# Patient Record
Sex: Female | Born: 1950 | Race: Black or African American | Hispanic: No | Smoking: Former smoker
Health system: Southern US, Community
[De-identification: ages and names within clinical notes are randomized; demographics above are authoritative.]

## PROBLEM LIST (undated history)

## (undated) DIAGNOSIS — M810 Age-related osteoporosis without current pathological fracture: Secondary | ICD-10-CM

## (undated) DIAGNOSIS — R0600 Dyspnea, unspecified: Secondary | ICD-10-CM

## (undated) DIAGNOSIS — I10 Essential (primary) hypertension: Secondary | ICD-10-CM

## (undated) DIAGNOSIS — C801 Malignant (primary) neoplasm, unspecified: Secondary | ICD-10-CM

## (undated) DIAGNOSIS — M5430 Sciatica, unspecified side: Secondary | ICD-10-CM

## (undated) DIAGNOSIS — I700 Atherosclerosis of aorta: Secondary | ICD-10-CM

## (undated) DIAGNOSIS — I422 Other hypertrophic cardiomyopathy: Secondary | ICD-10-CM

## (undated) DIAGNOSIS — R011 Cardiac murmur, unspecified: Secondary | ICD-10-CM

## (undated) DIAGNOSIS — M419 Scoliosis, unspecified: Secondary | ICD-10-CM

## (undated) DIAGNOSIS — R002 Palpitations: Secondary | ICD-10-CM

## (undated) DIAGNOSIS — E785 Hyperlipidemia, unspecified: Secondary | ICD-10-CM

## (undated) HISTORY — DX: Palpitations: R00.2

## (undated) HISTORY — DX: Essential (primary) hypertension: I10

## (undated) HISTORY — DX: Atherosclerosis of aorta: I70.0

## (undated) HISTORY — DX: Hyperlipidemia, unspecified: E78.5

## (undated) HISTORY — DX: Other hypertrophic cardiomyopathy: I42.2

## (undated) HISTORY — PX: WISDOM TOOTH EXTRACTION: SHX21

---

## 1995-10-09 HISTORY — PX: ABDOMINAL HYSTERECTOMY: SHX81

## 2006-10-08 HISTORY — PX: HIP ARTHROPLASTY: SHX981

## 2011-10-09 HISTORY — PX: HIP ARTHROPLASTY: SHX981

## 2021-09-06 ENCOUNTER — Other Ambulatory Visit: Payer: Self-pay

## 2021-09-06 ENCOUNTER — Other Ambulatory Visit: Payer: Self-pay | Admitting: Family Medicine

## 2021-09-06 ENCOUNTER — Ambulatory Visit
Admission: RE | Admit: 2021-09-06 | Discharge: 2021-09-06 | Disposition: A | Discharge status: Home / Self Care | Payer: Self-pay | Source: Ambulatory Visit | Attending: Family Medicine | Admitting: Family Medicine

## 2021-09-06 DIAGNOSIS — M25552 Pain in left hip: Secondary | ICD-10-CM

## 2021-09-06 DIAGNOSIS — M5430 Sciatica, unspecified side: Secondary | ICD-10-CM

## 2021-10-05 ENCOUNTER — Encounter (HOSPITAL_COMMUNITY): Payer: Self-pay | Admitting: Radiology

## 2021-10-24 ENCOUNTER — Other Ambulatory Visit: Payer: Self-pay | Admitting: Family Medicine

## 2021-10-24 DIAGNOSIS — R5381 Other malaise: Secondary | ICD-10-CM

## 2021-11-13 ENCOUNTER — Other Ambulatory Visit: Payer: Self-pay | Admitting: General Surgery

## 2021-11-13 DIAGNOSIS — M545 Low back pain, unspecified: Secondary | ICD-10-CM

## 2021-11-13 DIAGNOSIS — M5136 Other intervertebral disc degeneration, lumbar region: Secondary | ICD-10-CM

## 2021-12-06 ENCOUNTER — Ambulatory Visit
Admission: RE | Admit: 2021-12-06 | Discharge: 2021-12-06 | Disposition: A | Discharge status: Home / Self Care | Payer: Medicare HMO | Source: Ambulatory Visit | Attending: General Surgery | Admitting: General Surgery

## 2021-12-06 ENCOUNTER — Other Ambulatory Visit: Payer: Self-pay

## 2021-12-06 DIAGNOSIS — M545 Low back pain, unspecified: Secondary | ICD-10-CM

## 2021-12-06 DIAGNOSIS — M5136 Other intervertebral disc degeneration, lumbar region: Secondary | ICD-10-CM

## 2021-12-06 MED ORDER — GADOBENATE DIMEGLUMINE 529 MG/ML IV SOLN
15.0000 mL | Freq: Once | INTRAVENOUS | Status: AC | PRN
  Administered 2021-12-06: 15 mL via INTRAVENOUS

## 2022-01-04 ENCOUNTER — Ambulatory Visit: Payer: Medicare HMO | Admitting: Internal Medicine

## 2022-01-04 ENCOUNTER — Encounter: Payer: Self-pay | Admitting: Internal Medicine

## 2022-01-04 DIAGNOSIS — E042 Nontoxic multinodular goiter: Secondary | ICD-10-CM

## 2022-01-04 DIAGNOSIS — E213 Hyperparathyroidism, unspecified: Secondary | ICD-10-CM | POA: Diagnosis not present

## 2022-01-04 DIAGNOSIS — E559 Vitamin D deficiency, unspecified: Secondary | ICD-10-CM | POA: Diagnosis not present

## 2022-01-04 LAB — BASIC METABOLIC PANEL
BUN: 13 mg/dL (ref 6–23)
CO2: 28 mEq/L (ref 19–32)
Calcium: 10.3 mg/dL (ref 8.4–10.5)
Chloride: 105 mEq/L (ref 96–112)
Creatinine, Ser: 0.81 mg/dL (ref 0.40–1.20)
GFR: 73.41 mL/min (ref >60.00)
Glucose, Bld: 75 mg/dL (ref 70–99)
Potassium: 4 mEq/L (ref 3.5–5.1)
Sodium: 140 mEq/L (ref 135–145)

## 2022-01-04 LAB — ALBUMIN: Albumin: 4.2 g/dL (ref 3.5–5.2)

## 2022-01-04 LAB — VITAMIN D 25 HYDROXY (VIT D DEFICIENCY, FRACTURES): VITD: 18.83 ng/mL — ABNORMAL LOW (ref 30.00–100.00)

## 2022-01-04 LAB — T4, FREE: Free T4: 1.2 ng/dL (ref 0.60–1.60)

## 2022-01-04 LAB — TSH: TSH: 0.69 u[IU]/mL (ref 0.35–5.50)

## 2022-01-04 NOTE — Progress Notes (Signed)
? ? ?Name: Grace Dyer  ?MRN/ DOB: [id number], 1951-03-28    ?Age/ Sex: 71 y.o., female   ? ?PCP: Pcp, No   ?Reason for Endocrinology Evaluation: MNG/ Hypercalcemia   ?   ?Date of Initial Endocrinology Evaluation: 01/04/2022   ? ? ?HPI: ?Grace Dyer is a 71 y.o. female with a past medical history of HTN and Dyslipidemia . The patient presented for initial endocrinology clinic visit on 01/04/2022 for consultative assistance with her Hypercalcemia .  ? ?Grace Dyer indicates that she was first diagnosed with hypercalcemia in 2023.  ? ?Since that time, she has experienced symptoms of  polydipsia, denies constipation or polyuria. . She does use of over the counter calcium  but no lithium, HCTZ, or vitamin D supplements.  ? ?She denies  history of kidney stones, kidney disease, liver disease, granulomatous disease. She has osteoporosis but no  prior fractures. Daily dietary calcium intake: 1 servings . She denies  family history of osteoporosis, parathyroid disease, thyroid disease.  ? ?She has Hx of MNG , was seen by ENT while living in [place] years ago, no hx of FNa's  in the past  ?Has occasional local neck swelling  ? ? ? ? ?HISTORY:  ?Past Medical History: No past medical history on file. ?Past Surgical History:   ?Social History:  reports that she has never smoked. She has never been exposed to tobacco smoke. She has never used smokeless tobacco. ?Family History: family history is not on file. ? ? ?HOME MEDICATIONS: ?Allergies as of 01/04/2022   ?Not on File ?  ? ?  ?Medication List  ?  ? ?  ? Accurate as of January 04, 2022 11:53 AM. If you have any questions, ask your nurse or doctor.  ?  ?  ? ?  ? ?metoprolol succinate 50 MG 24 hr tablet ?Commonly known as: TOPROL-XL ?Take 50 mg by mouth daily. ?  ?NIFEdipine 60 MG 24 hr tablet ?Commonly known as: ADALAT CC ?Take 60 mg by mouth 2 (two) times daily. ?  ?rosuvastatin 10 MG tablet ?Commonly known as: CRESTOR ?Take 10 mg by mouth at bedtime. ?  ?traMADol 50 MG  tablet ?Commonly known as: ULTRAM ?Take 50 mg by mouth as needed. ?  ? ?  ?  ? ? ?REVIEW OF SYSTEMS: ?A comprehensive ROS was conducted with the patient and is negative except as per HPI and below:  ?ROS  ? ? ? ?OBJECTIVE:  ?VS: BP 130/78 (BP Location: Left Arm, Patient Position: Sitting, Cuff Size: Small)   Pulse 68   Ht '5\' 3"'$  (1.6 m)   Wt 165 lb 6.4 oz (75 kg)   SpO2 95%   BMI 29.30 kg/m?   ? ?Wt Readings from Last 3 Encounters:  ?01/04/22 165 lb 6.4 oz (75 kg)  ? ? ? ?EXAM: ?General: Pt appears well and is in NAD  ?Hydration: Well-hydrated with moist mucous membranes and good skin turgor  ?Eyes: External eye exam normal without stare, lid lag or exophthalmos.  EOM intact.  PERRL.  ?Neck: General: Supple without adenopathy. ?Thyroid: Thyroid size normal.  Right  nodule appreciated. No thyroid bruit.  ?Lungs: Clear with good BS bilat with no rales, rhonchi, or wheezes  ?Heart: Auscultation: RRR. + Murmur   ?Abdomen: Normoactive bowel sounds, soft, nontender, without masses or organomegaly palpable  ?Extremities:  ?BL LE: No pretibial edema normal ROM and strength.  ?Mental Status: Judgment, insight: Intact ?Orientation: Oriented to time, place, and person ?Mood and affect: No  depression, anxiety, or agitation  ? ? ? ?DATA REVIEWED: ? ? Latest Reference Range & Units 01/04/22 11:58  ?Sodium 135 - 145 mEq/L 140  ?Potassium 3.5 - 5.1 mEq/L 4.0  ?Chloride 96 - 112 mEq/L 105  ?CO2 19 - 32 mEq/L 28  ?Glucose 70 - 99 mg/dL 75  ?BUN 6 - 23 mg/dL 13  ?Creatinine 0.40 - 1.20 mg/dL 0.81  ?Calcium 8.4 - 10.5 mg/dL 10.3  ?Calcium Ionized 4.7 - 5.5 mg/dL 5.5  ?Albumin 3.5 - 5.2 g/dL 4.2  ?GFR >60.00 mL/min 73.41  ?VITD 30.00 - 100.00 ng/mL 18.83 (L)  ?PTH, Intact 16 - 77 pg/mL 110 (H)  ?TSH 0.35 - 5.50 uIU/mL 0.69  ?T4,Free(Direct) 0.60 - 1.60 ng/dL 1.20  ? ? ?  ? ?ASSESSMENT/PLAN/RECOMMENDATIONS:  ? ? ?Hyperparathyroidism: ?-Patient with elevated PTH, serum calcium including ionized calcium is at the upper limit of  normal ?-Patient tells me that she had submitted 24-hour urine collection and had bone density at PCPs office, these records are not available we will contact them for these results ? ?Recommendations ?Avoid OTC calcium tablets ?Stay hydrated ?Consume 2-3 servings of calcium daily ? ? ? ?2. MNG: ? ? ?-No local neck symptoms ?-She has right thyroid nodule on exam today, will proceed with thyroid ultrasound, she has no history of prior FNA ? ?3.  Vitamin D deficiency ? ? ?-Patient will be advised to start vitamin D3 2000 IU daily ? ?Follow-up in 4 months ? ?Signed electronically by: ?Abby Nena Jordan, MD ? ?[facility]  ?[facility] ?[address] ?[place], [place] [address] ?Phone: [phone] ?FAX: [phone] ? ? ?CC: ?Pcp, No ?No address on file ?Phone: None ?Fax: None ? ? ?Return to Endocrinology clinic as below: ?No future appointments. ?  ? ? ? ? ? ?

## 2022-01-05 ENCOUNTER — Telehealth: Payer: Self-pay | Admitting: Internal Medicine

## 2022-01-05 LAB — CALCIUM, IONIZED: Calcium, Ion: 5.5 mg/dL (ref 4.7–5.5)

## 2022-01-05 LAB — PARATHYROID HORMONE, INTACT (NO CA): PTH: 110 pg/mL — ABNORMAL HIGH (ref 16–77)

## 2022-01-05 NOTE — Telephone Encounter (Signed)
Please let the patient know that her parathyroid hormone remains elevated but her calcium is within the normal range at this time ? ? ?Please let the patient also know that her vitamin D is very low and she needs to start taking over-the-counter vitamin D3 2000 IU daily ? ? ?Otherwise her kidney function and thyroid are normal ? ?Thank you ?

## 2022-01-05 NOTE — Telephone Encounter (Signed)
When you get a chance can you please contact patient's PCP for the results of bone density and 24-hour urine collection? ? ? ?Patient said that she had this recently done through her PCPs office ? ? ? ?Thank you ?

## 2022-01-08 NOTE — Telephone Encounter (Signed)
Spoke with Denton Ar at [facility] and she will put in request for info to be faxed over  ?

## 2022-01-08 NOTE — Telephone Encounter (Signed)
Patient notified and will start the vitamin d.  ?

## 2022-02-15 ENCOUNTER — Other Ambulatory Visit: Payer: Self-pay | Admitting: Family Medicine

## 2022-02-15 ENCOUNTER — Other Ambulatory Visit (HOSPITAL_COMMUNITY): Payer: Self-pay | Admitting: Family Medicine

## 2022-02-15 ENCOUNTER — Telehealth: Payer: Self-pay

## 2022-02-15 DIAGNOSIS — R931 Abnormal findings on diagnostic imaging of heart and coronary circulation: Secondary | ICD-10-CM

## 2022-02-15 NOTE — Telephone Encounter (Signed)
NOTES SCANNED TO REFERRAL 

## 2022-02-26 ENCOUNTER — Encounter: Payer: Self-pay | Admitting: Internal Medicine

## 2022-02-26 ENCOUNTER — Ambulatory Visit (INDEPENDENT_AMBULATORY_CARE_PROVIDER_SITE_OTHER): Payer: Medicare HMO | Admitting: Internal Medicine

## 2022-02-26 VITALS — BP 122/66 | HR 74 | Ht 63.0 in | Wt 165.2 lb

## 2022-02-26 DIAGNOSIS — I10 Essential (primary) hypertension: Secondary | ICD-10-CM

## 2022-02-26 DIAGNOSIS — E782 Mixed hyperlipidemia: Secondary | ICD-10-CM | POA: Diagnosis not present

## 2022-02-26 DIAGNOSIS — I421 Obstructive hypertrophic cardiomyopathy: Secondary | ICD-10-CM | POA: Diagnosis not present

## 2022-02-26 DIAGNOSIS — R002 Palpitations: Secondary | ICD-10-CM

## 2022-02-26 MED ORDER — VERAPAMIL HCL ER 120 MG PO CP24: 120.0000 mg | ORAL_CAPSULE | Freq: Every day | ORAL | 3 refills | Status: DC

## 2022-02-26 MED ORDER — METOPROLOL SUCCINATE ER 100 MG PO TB24: 100.0000 mg | ORAL_TABLET | Freq: Every day | ORAL | 3 refills | Status: AC

## 2022-02-26 NOTE — Progress Notes (Signed)
OFFICE CONSULT NOTE  Chief Complaint:  Palpitations, abnormal echo  Primary Care Physician: Grace Nova, MD  HPI:  Grace Dyer is a 71 y.o. female who is being seen today for the evaluation of palpitations and abnormal echo at the request of Grace Nova, MD. this is a pleasant 71 year old female who recently moved full-time from [facility] to [place].  She says she lived in the area on and off for many years but decided to settle here with her children after retirement.  She was getting cardiac care from at least 2 different cardiologist in [place] but was unsatisfied with it.  She is struggled with palpitations in the past.  She also has a history of hypertension.  Here she has had several episodes of palpitations which include significant tachycardia.  This could occurs at rest and is quite random.  She says she feels very weak when she has these episodes.  She also feels weakness and almost a feeling like she might pass out at times when she gets dehydrated or has a fever or when she has these palpitations.  An echocardiogram was performed which is scanned in under the media tab on Feb 15, 2022.  This demonstrated severe asymmetric septal hypertrophy measuring 1.9 cm.  There was systolic anterior motion of the mitral valve leaflet and a resting gradient of 29 mmHg.  A provoked gradient was not noted.  The findings were suggestive of hypertrophic cardiomyopathy and a cardiac MRI was recommended.  LVEF 65 to 70%.  Subsequently she wore monitoring which demonstrated numerous episodes of SVT and a short episode of NSVT.  She has been on metoprolol for some time and Procardia for hypertension.  Her PCP has ordered a cardiac MRI which is already scheduled for June 26.  She reports no frank syncopal episodes although she has been presyncopal.  She says there is a family history of heart disease but no history of early or sudden cardiac death.  PMHx:  Past Medical  History:  Diagnosis Date   Aortic atherosclerosis ([place])    Dyslipidemia    Hypertension    Hypertrophic cardiomyopathy ([place])    Palpitations    FAMHx:  Heart disease is in her family.  SOCHx:   reports that she has never smoked. She has never been exposed to tobacco smoke. She has never used smokeless tobacco. No history on file for alcohol use and drug use.  ALLERGIES:  Allergies  Allergen Reactions   Codeine Other (See Comments)    DIZZINESS    ROS: Pertinent items noted in HPI and remainder of comprehensive ROS otherwise negative.  HOME MEDS: Current Outpatient Medications on File Prior to Visit  Medication Sig Dispense Refill   rosuvastatin (CRESTOR) 10 MG tablet Take 10 mg by mouth at bedtime.     traMADol (ULTRAM) 50 MG tablet Take 100 mg by mouth as needed.     No current facility-administered medications on file prior to visit.    LABS/IMAGING: No results found for this or any previous visit (from the past 48 hour(s)). No results found.  LIPID PANEL: No results found for: CHOL, TRIG, HDL, CHOLHDL, VLDL, LDLCALC, LDLDIRECT  WEIGHTS: Wt Readings from Last 3 Encounters:  02/26/22 165 lb 3.2 oz (74.9 kg)  01/04/22 165 lb 6.4 oz (75 kg)    VITALS: BP 122/66   Pulse 74   Ht '5\' 3"'$  (1.6 m)   Wt 165 lb 3.2 oz (74.9 kg)  SpO2 97%   BMI 29.26 kg/m   EXAM: General appearance: alert and no distress Neck: no carotid bruit, no JVD, and thyroid not enlarged, symmetric, no tenderness/mass/nodules Lungs: clear to auscultation bilaterally Heart: regular rate and rhythm, S1, S2 normal, and systolic murmur: systolic ejection 3/6, crescendo at 2nd right intercostal space Abdomen: soft, non-tender; bowel sounds normal; no masses,  no organomegaly Extremities: extremities normal, atraumatic, no cyanosis or edema Pulses: 2+ and symmetric Skin: Skin color, texture, turgor normal. No rashes or lesions Neurologic: Grossly normal Psych: Pleasant  EKG: Normal sinus  rhythm at 71, LVH by voltage with septal T wave inversion- personally reviewed  ASSESSMENT: HOCM with a 29 mm rest gradient and SAM, septal diameter 1.9 cm (2023), LVEF 65 to 70% Palpitations-SVT and NSVT Dyslipidemia Aortic atherosclerosis Hypertension  PLAN: 1.   [name] has HOCM by echo although seems to be minimally symptomatic.  It sounds like when she has her SVT or is sick with fever or is dehydrated then she becomes symptomatic likely due to low preload.  She has already been ordered for cardiac MRI by her PCP.  This is scheduled in June.  I would agree with this recommendation.  Blood pressure is well controlled however she is on Procardia.  She is having breakthrough palpitations.  I would advise increasing her metoprolol from 50 to 100 mg daily and switching her Procardia to verapamil which would be more preferred with her HOCM physiology.  Would asked that she follow-up with Dr. Julieanne Dyer in our [place] clinic after her cardiac MRI for his expert recommendations.  Thanks again for the kind referral.  Grace Casino, MD, FACC, [place] Director of the Advanced Lipid Disorders &  Cardiovascular Risk Reduction Clinic Diplomate of the American Board of Clinical Lipidology Attending Cardiologist  Direct Dial: [phone]  Fax: [phone]  Website:  [email or web address]  Grace Dyer 02/26/2022, 1:27 PM

## 2022-02-26 NOTE — Patient Instructions (Signed)
Medication Instructions:  INCREASE metoprolol succinate to '100mg'$  daily  STOP nifedipine  START verapamil '120mg'$  daily  *If you need a refill on your cardiac medications before your next appointment, please call your pharmacy*  Testing/Procedures: Cardiac MRI as scheduled in June   Follow-Up: At [facility], you and your health needs are our priority.  As part of our continuing mission to provide you with exceptional heart care, we have created designated Provider Care Teams.  These Care Teams include your primary Cardiologist (physician) and Advanced Practice Providers (APPs -  Physician Assistants and Nurse Practitioners) who all work together to provide you with the care you need, when you need it.  We recommend signing up for the patient portal called "MyChart".  Sign up information is provided on this After Visit Summary.  MyChart is used to connect with patients for Virtual Visits (Telemedicine).  Patients are able to view lab/test results, encounter notes, upcoming appointments, etc.  Non-urgent messages can be sent to your provider as well.   To learn more about what you can do with MyChart, go to [email or web address].    Your next appointment:   Rudean Haskell, MD -- [place] [address] [place] -- after cardiac MRI

## 2022-03-16 ENCOUNTER — Encounter (HOSPITAL_COMMUNITY): Payer: Self-pay

## 2022-03-16 ENCOUNTER — Ambulatory Visit (HOSPITAL_COMMUNITY)
Admission: EM | Admit: 2022-03-16 | Discharge: 2022-03-16 | Disposition: A | Discharge status: Home / Self Care | Payer: Medicare HMO | Attending: Emergency Medicine | Admitting: Emergency Medicine

## 2022-03-16 ENCOUNTER — Emergency Department (HOSPITAL_COMMUNITY)
Admission: EM | Admit: 2022-03-16 | Discharge: 2022-03-16 | Disposition: A | Discharge status: Home / Self Care | Payer: Medicare HMO | Attending: Emergency Medicine | Admitting: Emergency Medicine

## 2022-03-16 ENCOUNTER — Emergency Department (HOSPITAL_COMMUNITY): Payer: Medicare HMO

## 2022-03-16 DIAGNOSIS — R002 Palpitations: Secondary | ICD-10-CM

## 2022-03-16 DIAGNOSIS — J029 Acute pharyngitis, unspecified: Secondary | ICD-10-CM | POA: Insufficient documentation

## 2022-03-16 DIAGNOSIS — R0602 Shortness of breath: Secondary | ICD-10-CM

## 2022-03-16 DIAGNOSIS — R059 Cough, unspecified: Secondary | ICD-10-CM | POA: Diagnosis not present

## 2022-03-16 LAB — BASIC METABOLIC PANEL
Anion gap: 7 (ref 5–15)
BUN: 11 mg/dL (ref 8–23)
CO2: 25 mmol/L (ref 22–32)
Calcium: 10.1 mg/dL (ref 8.9–10.3)
Chloride: 110 mmol/L (ref 98–111)
Creatinine, Ser: 0.75 mg/dL (ref 0.44–1.00)
GFR, Estimated: >60 mL/min (ref >60)
Glucose, Bld: 115 mg/dL — ABNORMAL HIGH (ref 70–99)
Potassium: 3.4 mmol/L — ABNORMAL LOW (ref 3.5–5.1)
Sodium: 142 mmol/L (ref 135–145)

## 2022-03-16 LAB — CBC
HCT: 34.6 % — ABNORMAL LOW (ref 36.0–46.0)
Hemoglobin: 11.7 g/dL — ABNORMAL LOW (ref 12.0–15.0)
MCH: 32.5 pg (ref 26.0–34.0)
MCHC: 33.8 g/dL (ref 30.0–36.0)
MCV: 96.1 fL (ref 80.0–100.0)
Platelets: 223 10*3/uL (ref 150–400)
RBC: 3.6 MIL/uL — ABNORMAL LOW (ref 3.87–5.11)
RDW: 14.3 % (ref 11.5–15.5)
WBC: 7.1 10*3/uL (ref 4.0–10.5)
nRBC: 0 % (ref 0.0–0.2)

## 2022-03-16 LAB — TROPONIN I (HIGH SENSITIVITY)
Troponin I (High Sensitivity): 7 ng/L (ref <18)
Troponin I (High Sensitivity): 7 ng/L (ref <18)

## 2022-03-16 MED ORDER — ALBUTEROL SULFATE HFA 108 (90 BASE) MCG/ACT IN AERS
2.0000 | INHALATION_SPRAY | Freq: Once | RESPIRATORY_TRACT | Status: AC
  Administered 2022-03-16: 2 via RESPIRATORY_TRACT
  Filled 2022-03-16: qty 6.7

## 2022-03-16 MED ORDER — POTASSIUM CHLORIDE CRYS ER 20 MEQ PO TBCR
40.0000 meq | EXTENDED_RELEASE_TABLET | Freq: Once | ORAL | Status: AC
  Administered 2022-03-16: 40 meq via ORAL
  Filled 2022-03-16: qty 2

## 2022-03-16 NOTE — Discharge Instructions (Addendum)
If you develop new or worsening shortness of breath or if you develop chest pain, coughing up blood, fever, or any other new/concerning symptoms then return to the ER for evaluation.  You may use the albuterol inhaler 1-2 puffs every 4 hours as needed for cough or shortness of breath.  If he needed significantly more than this then return to the ER for evaluation.
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
## 2022-03-16 NOTE — Discharge Instructions (Addendum)
Please go to the nearest hospital for further evaluation of your heart, the EKG completed today shows abnormality when compared to your EKG on 02/26/2022 done in the doctor's office
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
## 2022-03-16 NOTE — ED Triage Notes (Signed)
Pt states that last night a neighbor was burning incense and pt began experiencing SOB and feeling generally unwell. Pt was seen at [facility] for same and sent to ED. Discharge paperwork notes change in EKG from recent visit on 5/22.

## 2022-03-29 ENCOUNTER — Telehealth (HOSPITAL_COMMUNITY): Payer: Self-pay | Admitting: Emergency Medicine

## 2022-03-29 NOTE — Telephone Encounter (Signed)
Reaching out to patient to offer assistance regarding upcoming cardiac imaging study; pt verbalizes understanding of appt date/time, parking situation and where to check in, pre-test NPO status and medications ordered, and verified current allergies; name and call back number provided for further questions should they arise Grace Bond RN Navigator Cardiac Imaging [facility] [phone] office [phone] cell  Arrival 330 Denies claustro Bilat hip replacements  Denies iv issues

## 2022-04-02 ENCOUNTER — Ambulatory Visit (HOSPITAL_COMMUNITY): Admission: RE | Admit: 2022-04-02 | Payer: Medicare HMO | Source: Ambulatory Visit

## 2022-04-03 ENCOUNTER — Other Ambulatory Visit (HOSPITAL_COMMUNITY): Payer: Medicare HMO

## 2022-04-18 ENCOUNTER — Telehealth (HOSPITAL_COMMUNITY): Payer: Self-pay | Admitting: Emergency Medicine

## 2022-04-18 NOTE — Telephone Encounter (Signed)
Attempted to call patient regarding upcoming cardiac MRI appointment. Left message on voicemail with name and callback number Zelta Enfield RN Navigator Cardiac Imaging [facility] [phone] Office [phone] Cell  

## 2022-04-19 ENCOUNTER — Ambulatory Visit (HOSPITAL_COMMUNITY)
Admission: RE | Admit: 2022-04-19 | Discharge: 2022-04-19 | Disposition: A | Discharge status: Home / Self Care | Payer: Medicare HMO | Source: Ambulatory Visit | Attending: Family Medicine | Admitting: Family Medicine

## 2022-04-19 ENCOUNTER — Other Ambulatory Visit (HOSPITAL_COMMUNITY): Payer: Self-pay | Admitting: Family Medicine

## 2022-04-19 DIAGNOSIS — I3139 Other pericardial effusion (noninflammatory): Secondary | ICD-10-CM

## 2022-04-19 DIAGNOSIS — R931 Abnormal findings on diagnostic imaging of heart and coronary circulation: Secondary | ICD-10-CM | POA: Diagnosis present

## 2022-04-19 MED ORDER — GADOBUTROL 1 MMOL/ML IV SOLN
9.0000 mL | Freq: Once | INTRAVENOUS | Status: AC | PRN
  Administered 2022-04-19: 9 mL via INTRAVENOUS

## 2022-04-25 ENCOUNTER — Ambulatory Visit (INDEPENDENT_AMBULATORY_CARE_PROVIDER_SITE_OTHER): Payer: Medicare HMO | Admitting: Internal Medicine

## 2022-04-25 ENCOUNTER — Encounter: Payer: Self-pay | Admitting: Internal Medicine

## 2022-04-25 VITALS — BP 140/82 | HR 80 | Ht 63.0 in | Wt 161.4 lb

## 2022-04-25 DIAGNOSIS — E213 Hyperparathyroidism, unspecified: Secondary | ICD-10-CM | POA: Insufficient documentation

## 2022-04-25 DIAGNOSIS — E559 Vitamin D deficiency, unspecified: Secondary | ICD-10-CM | POA: Diagnosis not present

## 2022-04-25 DIAGNOSIS — E042 Nontoxic multinodular goiter: Secondary | ICD-10-CM | POA: Insufficient documentation

## 2022-04-25 LAB — BASIC METABOLIC PANEL
BUN: 20 mg/dL (ref 6–23)
CO2: 28 mEq/L (ref 19–32)
Calcium: 10.4 mg/dL (ref 8.4–10.5)
Chloride: 104 mEq/L (ref 96–112)
Creatinine, Ser: 0.89 mg/dL (ref 0.40–1.20)
GFR: 65.43 mL/min (ref >60.00)
Glucose, Bld: 84 mg/dL (ref 70–99)
Potassium: 4.3 mEq/L (ref 3.5–5.1)
Sodium: 138 mEq/L (ref 135–145)

## 2022-04-25 LAB — ALBUMIN: Albumin: 4 g/dL (ref 3.5–5.2)

## 2022-04-25 LAB — VITAMIN D 25 HYDROXY (VIT D DEFICIENCY, FRACTURES): VITD: 19.63 ng/mL — ABNORMAL LOW (ref 30.00–100.00)

## 2022-04-25 NOTE — Patient Instructions (Signed)
Stay Hydrated  Avoid over the counter calcium tablet  Consume 2-3 servings of calcium in the food daily ( low fat dairy and green leafy vegetables) Vitamin D 2000 iu daily

## 2022-04-25 NOTE — Progress Notes (Signed)
Name: Grace Dyer  MRN/ DOB: [id number], 03-30-1951    Age/ Sex: 71 y.o., female    PCP: Roselee Nova, MD   Reason for Endocrinology Evaluation: MNG/ Hypercalcemia      Date of Initial Endocrinology Evaluation: 01/04/2022    HPI: Grace Dyer is a 71 y.o. female with a past medical history of HTN, cardiomyopathy,SVT and Dyslipidemia . The patient presented for initial endocrinology clinic visit on 01/04/2022 for consultative assistance with her Hypercalcemia .   Grace Dyer indicates that she was first diagnosed with hypercalcemia in 2023.   She has no prior history of lithium, or HCTZ use  She denies  history of kidney stones, kidney disease, liver disease, granulomatous disease. She has osteoporosis but no  prior fractures.  She denies  family history of osteoporosis, parathyroid disease, thyroid disease.   She has Hx of MNG , was seen by ENT while living in [place] years ago, no hx of FNa's  in the past  Has occasional local neck swelling   24-hour urine collection for calcium 179 mg on 10/25/2021 DXA 10/2021- normal AP T-score -0.5, 1/3rd forearm -0.9    On her initial visit to our clinic she was noted with low vitamin D, she was started on a OTC vitamin D3 2000 IU daily  SUBJECTIVE:     Today (04/25/22): [name] is here for follow-up on hypercalcemia, hyperparathyroidism, and multinodular goiter.  She presented to the ED in June 2023 with upper respiratory symptoms She is not on vitamin D , forgot to take vitamin D  She has been consuming calcium through her diet She stays hydrated  Denies constipation  Has occasional polydipsia but no frequency  She did not have her thyroid ultrasound yet     Vitamin D3 2000 iu daily - not taking   HISTORY:  Past Medical History:  Past Medical History:  Diagnosis Date   Aortic atherosclerosis (HCC)    Dyslipidemia    Hypertension    Hypertrophic cardiomyopathy ([place])    Palpitations    Past Surgical  History:   Social History:  reports that she has never smoked. She has never been exposed to tobacco smoke. She has never used smokeless tobacco. Family History: family history is not on file.   HOME MEDICATIONS: Allergies as of 04/25/2022       Reactions   Codeine Other (See Comments)   DIZZINESS        Medication List        Accurate as of April 25, 2022  7:14 AM. If you have any questions, ask your nurse or doctor.          metoprolol succinate 100 MG 24 hr tablet Commonly known as: TOPROL-XL Take 1 tablet (100 mg total) by mouth daily.   rosuvastatin 10 MG tablet Commonly known as: CRESTOR Take 10 mg by mouth at bedtime.   traMADol 50 MG tablet Commonly known as: ULTRAM Take 100 mg by mouth as needed.   verapamil 120 MG 24 hr capsule Commonly known as: Verelan Take 1 capsule (120 mg total) by mouth at bedtime.          REVIEW OF SYSTEMS: A comprehensive ROS was conducted with the patient and is negative except as per HPI and below:  ROS     OBJECTIVE:  VS:BP 140/82 (BP Location: Left Arm, Patient Position: Sitting, Cuff Size: Large)   Pulse 80   Ht '5\' 3"'$  (1.6 m)   Wt 161 lb  6.4 oz (73.2 kg)   SpO2 96%   BMI 28.59 kg/m    Wt Readings from Last 3 Encounters:  03/16/22 167 lb (75.8 kg)  02/26/22 165 lb 3.2 oz (74.9 kg)  01/04/22 165 lb 6.4 oz (75 kg)     EXAM: General: Pt appears well and is in NAD  Hydration: Well-hydrated with moist mucous membranes and good skin turgor  Eyes: External eye exam normal without stare, lid lag or exophthalmos.  EOM intact.    Neck: General: Supple without adenopathy. Thyroid: Thyroid size normal.  Right  nodule appreciated. No thyroid bruit.  Lungs: Clear with good BS bilat with no rales, rhonchi, or wheezes  Heart: Auscultation: RRR. + Murmur   Abdomen: Normoactive bowel sounds, soft, nontender, without masses or organomegaly palpable  Extremities:  BL LE: No pretibial edema normal ROM and strength.   Mental Status: Judgment, insight: Intact Orientation: Oriented to time, place, and person Mood and affect: No depression, anxiety, or agitation     DATA REVIEWED:   Latest Reference Range & Units 04/25/22 09:51  Sodium 135 - 145 mEq/L 138  Potassium 3.5 - 5.1 mEq/L 4.3  Chloride 96 - 112 mEq/L 104  CO2 19 - 32 mEq/L 28  Glucose 70 - 99 mg/dL 84  BUN 6 - 23 mg/dL 20  Creatinine 0.40 - 1.20 mg/dL 0.89  Calcium 8.4 - 10.5 mg/dL 10.4  Albumin 3.5 - 5.2 g/dL 4.0  GFR >60.00 mL/min 65.43  VITD 30.00 - 100.00 ng/mL 19.63 (L)    DXA 10/2021  AP t-score -0.5 1/3rd distal forearm -0.9  ASSESSMENT/PLAN/RECOMMENDATIONS:    Hyperparathyroidism:  -Patient with elevated PTH, serum calcium including ionized calcium is at the upper limit of normal -Repeat serum calcium continues to be normal, PTH pending -24-hour urinary calcium was normal at 179 mg in January 2023 -She had a normal DXA scan in January 2023 including a T score of -0.9 at the distal one third of the forearm -No indication for surgical intervention at this time    Recommendations Avoid OTC calcium tablets Stay hydrated Consume 2-3 servings of calcium daily    2. MNG:  -No local neck symptoms -She has right thyroid nodule on exam today, I have ordered thyroid ultrasound on her last visit here but this has not been done yet - will proceed with another order for thyroid ultrasound, she has no history of prior FNA  3.  Vitamin D deficiency   -Patient will be advised to start vitamin D3 2000 IU daily  Follow-up in 4 months  Signed electronically by: Mack Guise, MD  [facility] Endocrinology  [facility] [address] [place], [place] [address] Phone: [phone] FAX: [phone]   CC: Roselee Nova, [place] [address] Phone: [phone] Fax: [phone]   Return to Endocrinology clinic as below: Future Appointments  Date Time  Provider [place]  04/25/2022 10:10 AM Chantelle Verdi, Melanie Crazier, MD [facility] None  04/30/2022  9:40 AM Werner Lean, MD [facility]

## 2022-04-26 ENCOUNTER — Encounter: Payer: Self-pay | Admitting: Internal Medicine

## 2022-04-26 LAB — PARATHYROID HORMONE, INTACT (NO CA): PTH: 90 pg/mL — ABNORMAL HIGH (ref 16–77)

## 2022-04-26 LAB — CALCIUM, IONIZED: Calcium, Ion: 5.6 mg/dL — ABNORMAL HIGH (ref 4.7–5.5)

## 2022-04-28 NOTE — Progress Notes (Unsigned)
Cardiology Office Note:    Date:  04/30/2022   ID:  [name], DOB Feb 28, 1951, MRN [id number]  PCP:  Roselee Nova, MD   [place] Providers Cardiologist:  None     Referring MD: Roselee Nova, MD   CC: HCM Consulted for the evaluation of HCM at the behest of Dr. Debara Pickett  History of Present Illness:    Grace Dyer is a 71 y.o. female with a hx of HCM (resting LVOT gradient 29, max thickness 19 mm, ~1% LGE using 6 standard deviation approach).    Feels OK Takes care of her grandchildren, tutoring youngest grandchild. Doesn't do much exercise because she feels palpitations; sometimes with near syncope; decreased her exercise in order to keep her from having symptoms. Patient notes no SOB at rest and DOE rarely going up stairs Keeps well hydrated.  Eats small frequent meals Notes a 50 lb weight loss that she attributes to changing her died (over that span of one year) Notes no fatigue. Notes only exertional palpitations; history of nocturnal palpitations but none lately Notes no CP. Notes no Dizziness. Notes no syncope. Notable family events include No HCM history; GMA died at 18, Mother died at 71 of unclear causes (didn't talk about her medical history) four kids and 12 grandchildren.   Past Medical History:  Diagnosis Date   Aortic atherosclerosis ([place])    Dyslipidemia    Hypertension    Hypertrophic cardiomyopathy ([place])    Palpitations     No past surgical history on file.  Current Medications: Current Meds  Medication Sig   metoprolol succinate (TOPROL-XL) 100 MG 24 hr tablet Take 1 tablet (100 mg total) by mouth daily.   NIFEdipine (ADALAT CC) 60 MG 24 hr tablet Take 1 tablet (60 mg total) by mouth daily.   rosuvastatin (CRESTOR) 10 MG tablet Take 10 mg by mouth at bedtime.   traMADol (ULTRAM-ER) 100 MG 24 hr tablet Take 150 mg by mouth daily.   [DISCONTINUED] verapamil (VERELAN) 120 MG 24 hr capsule Take 1 capsule (120 mg total) by  mouth at bedtime.     Allergies:   Codeine   Social History   Socioeconomic History   Marital status: Widowed    Spouse name: Not on file   Number of children: Not on file   Years of education: Not on file   Highest education level: Not on file  Occupational History   Not on file  Tobacco Use   Smoking status: Never    Passive exposure: Never   Smokeless tobacco: Never  Substance and Sexual Activity   Alcohol use: Not on file   Drug use: Not on file   Sexual activity: Not on file  Other Topics Concern   Not on file  Social History Narrative   Not on file   Social Determinants of Health   Financial Resource Strain: Not on file  Food Insecurity: Not on file  Transportation Needs: Not on file  Physical Activity: Not on file  Stress: Not on file  Social Connections: Not on file    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     All other systems reviewed and are negative.  EKGs/Labs/Other Studies Reviewed:     Recent Labs: 01/04/2022: TSH 0.69 03/16/2022: Hemoglobin 11.7; Platelets 223 04/25/2022: BUN 20; Creatinine, Ser 0.89; Potassium 4.3; Sodium 138  Recent Lipid Panel  No results found for: "CHOL", "TRIG", "HDL", "CHOLHDL", "VLDL", "LDLCALC", "LDLDIRECT"      Physical Exam:    VS:  BP (!) 152/73   Pulse (!) 46   Ht '5\' 3"'$  (1.6 m)   Wt 162 lb (73.5 kg)   SpO2 97%   BMI 28.70 kg/m     Wt Readings from Last 3 Encounters:  04/30/22 162 lb (73.5 kg)  04/25/22 161 lb 6.4 oz (73.2 kg)  03/16/22 167 lb (75.8 kg)    GEN:  Well nourished, well developed in no acute distress HEENT: Normal NECK: No JVD LYMPHATICS: No lymphadenopathy CARDIAC: regular bradycardia with systolic murmur while standing and with hand grip, no rubs, gallops RESPIRATORY:  Clear to auscultation without rales, wheezing or rhonchi  ABDOMEN: Soft, non-tender, non-distended MUSCULOSKELETAL:  No  edema; No deformity  SKIN: Warm and dry NEUROLOGIC:  Alert and oriented x 3 PSYCHIATRIC:  Normal affect   ASSESSMENT:    1. Hypertrophic cardiomyopathy (HCC)    PLAN:    Hypertrophic Cardiomyopathy - Septal Variant - peak gradient 29 mm Hg on resting echo   - NYHA II  - Family history with no prior HCM or SCD, Discussed family screening  (Recommend genetics testing- will refer)   -Will do exercise Stress Echo to assess for inducible gradient - CMR notable thickness, minimal LGE using 6 standard deviation method  - SVT and one episode of NSVT form heart monitor from Dr. Manuella Ghazi, will get original monitor study  - continue succinate 100 mg Daily - patient is adamant that her BP is best controlled on nifedipine; she has slow heart rates of verapamil; though diltiazem or verapamil are better choice are and I offered her aldactone for BP; she would like to switch back to her nifedinpine 60 mg PO daily  Will see in three months for SCD discussion, post eval for inducible gradient, and after genetics discussion  Time Spent Directly with Patient:   I have spent a total of 40 minutes with the patient reviewing notes, imaging, EKGs, labs and examining the patient as well as establishing an assessment and plan that was discussed personally with the patient.  > 50% of time was spent in direct patient care.   Medication Adjustments/Labs and Tests Ordered: Current medicines are reviewed at length with the patient today.  Concerns regarding medicines are outlined above.  Orders Placed This Encounter  Procedures   Cardiac Stress Test: Informed Consent Details: Physician/Practitioner Attestation; Transcribe to consent form and obtain patient signature   ECHOCARDIOGRAM STRESS TEST   Meds ordered this encounter  Medications   NIFEdipine (ADALAT CC) 60 MG 24 hr tablet    Sig: Take 1 tablet (60 mg total) by mouth daily.    Dispense:  90 tablet    Refill:  3    Patient Instructions   Medication Instructions:  Your physician has recommended you make the following change in your medication:  STOP: verapamil   START: nifedipine (Adalat CC) 60 mg by mouth once daily  *If you need a refill on your cardiac medications before your next appointment, please call your pharmacy*   Lab Work: NONE If you have labs (blood work) drawn today and your tests are completely normal, you will receive your results only by: [place] (if you have MyChart) OR A paper copy in the mail If you have any lab test that is abnormal or we need to change your treatment, we will call you to review the results.   Testing/Procedures:  Your physician has requested that you have a stress echocardiogram. For further information please visit [email or web address]. Please follow instruction sheet as given.   Your physician has referred you to see a Dietitian.    Follow-Up: At [facility], you and your health needs are our priority.  As part of our continuing mission to provide you with exceptional heart care, we have created designated Provider Care Teams.  These Care Teams include your primary Cardiologist (physician) and Advanced Practice Providers (APPs -  Physician Assistants and Nurse Practitioners) who all work together to provide you with the care you need, when you need it.  We recommend signing up for the patient portal called "MyChart".  Sign up information is provided on this After Visit Summary.  MyChart is used to connect with patients for Virtual Visits (Telemedicine).  Patients are able to view lab/test results, encounter notes, upcoming appointments, etc.  Non-urgent messages can be sent to your provider as well.   To learn more about what you can do with MyChart, go to [email or web address].    Your next appointment:   3 month(s)  The format for your next appointment:   In Person  Provider:   Rudean Haskell, MD   Important Information About Sugar          Signed, Werner Lean, MD  04/30/2022 10:36 AM    [place]

## 2022-04-30 ENCOUNTER — Ambulatory Visit: Payer: Medicare HMO | Admitting: Internal Medicine

## 2022-04-30 ENCOUNTER — Encounter: Payer: Self-pay | Admitting: Internal Medicine

## 2022-04-30 VITALS — BP 152/73 | HR 46 | Ht 63.0 in | Wt 162.0 lb

## 2022-04-30 DIAGNOSIS — I422 Other hypertrophic cardiomyopathy: Secondary | ICD-10-CM

## 2022-04-30 MED ORDER — NIFEDIPINE ER 60 MG PO TB24: 60.0000 mg | ORAL_TABLET | Freq: Every day | ORAL | 3 refills | Status: DC

## 2022-04-30 NOTE — Patient Instructions (Signed)
Medication Instructions:  Your physician has recommended you make the following change in your medication:  STOP: verapamil   START: nifedipine (Adalat CC) 60 mg by mouth once daily  *If you need a refill on your cardiac medications before your next appointment, please call your pharmacy*   Lab Work: NONE If you have labs (blood work) drawn today and your tests are completely normal, you will receive your results only by: [place] (if you have MyChart) OR A paper copy in the mail If you have any lab test that is abnormal or we need to change your treatment, we will call you to review the results.   Testing/Procedures: Your physician has requested that you have a stress echocardiogram. For further information please visit [email or web address]. Please follow instruction sheet as given.   Your physician has referred you to see a Dietitian.    Follow-Up: At [facility], you and your health needs are our priority.  As part of our continuing mission to provide you with exceptional heart care, we have created designated Provider Care Teams.  These Care Teams include your primary Cardiologist (physician) and Advanced Practice Providers (APPs -  Physician Assistants and Nurse Practitioners) who all work together to provide you with the care you need, when you need it.  We recommend signing up for the patient portal called "MyChart".  Sign up information is provided on this After Visit Summary.  MyChart is used to connect with patients for Virtual Visits (Telemedicine).  Patients are able to view lab/test results, encounter notes, upcoming appointments, etc.  Non-urgent messages can be sent to your provider as well.   To learn more about what you can do with MyChart, go to [email or web address].    Your next appointment:   3 month(s)  The format for your next appointment:   In Person  Provider:   Rudean Haskell, MD   Important Information About Sugar

## 2022-05-04 ENCOUNTER — Ambulatory Visit
Admission: RE | Admit: 2022-05-04 | Discharge: 2022-05-04 | Disposition: A | Discharge status: Home / Self Care | Payer: Medicare HMO | Source: Ambulatory Visit | Attending: Internal Medicine | Admitting: Internal Medicine

## 2022-05-04 DIAGNOSIS — E042 Nontoxic multinodular goiter: Secondary | ICD-10-CM

## 2022-05-08 ENCOUNTER — Telehealth: Payer: Self-pay | Admitting: Internal Medicine

## 2022-05-08 DIAGNOSIS — E042 Nontoxic multinodular goiter: Secondary | ICD-10-CM

## 2022-05-08 NOTE — Telephone Encounter (Signed)
Please let the pt know that her thyroid ultrasound showed 4 thyroid nodules and two of them needs to be biopsied to make sure there's no cancer. I have placed the orders because I am out of the office and didn't want to wait.     Let her know these will be done at the same place where the ultrasound was done.  She will not be put out to sleep but they will numb the area.     Thanks   Abby Nena Jordan, MD  [facility] Endocrinology  [facility] [address] [place], [place] [address] Phone: [phone] FAX: [phone]'

## 2022-05-09 ENCOUNTER — Telehealth (HOSPITAL_COMMUNITY): Payer: Self-pay | Admitting: *Deleted

## 2022-05-09 NOTE — Telephone Encounter (Signed)
Patient given detailed instructions per Stress Test Requisition Sheet for test on 05/15/22 at 1400.Patient Notified to arrive 30 minutes early, and that it is imperative to arrive on time for appointment to keep from having the test rescheduled.  Patient verbalized understanding. Tonjia Parillo, [place]

## 2022-05-09 NOTE — Telephone Encounter (Signed)
Patient has been notified and understands. She will call to schedule. States she received call on yesterday from them.

## 2022-05-11 ENCOUNTER — Ambulatory Visit
Admission: RE | Admit: 2022-05-11 | Discharge: 2022-05-11 | Disposition: A | Discharge status: Home / Self Care | Payer: Medicare HMO | Source: Ambulatory Visit | Attending: Internal Medicine | Admitting: Internal Medicine

## 2022-05-11 ENCOUNTER — Other Ambulatory Visit (HOSPITAL_COMMUNITY)
Admission: RE | Admit: 2022-05-11 | Discharge: 2022-05-11 | Disposition: A | Discharge status: Home / Self Care | Payer: Medicare HMO | Source: Ambulatory Visit | Attending: Interventional Radiology | Admitting: Interventional Radiology

## 2022-05-11 ENCOUNTER — Other Ambulatory Visit: Payer: Self-pay | Admitting: Internal Medicine

## 2022-05-11 DIAGNOSIS — E042 Nontoxic multinodular goiter: Secondary | ICD-10-CM

## 2022-05-11 DIAGNOSIS — E041 Nontoxic single thyroid nodule: Secondary | ICD-10-CM | POA: Diagnosis present

## 2022-05-11 NOTE — Procedures (Signed)
Interventional Radiology Procedure Note  Procedure: US guided biopsy of 2 right side thyroid nodules.  #1 and #3 based on prior report. Both calcified. 5x25g biopsy performed for each, with AFIRMA Complications: None EBL: None Recommendations:   - Routine wound care - Follow up pathology    Signed,  Corrie Mckusick, DO

## 2022-05-14 LAB — CYTOLOGY - NON PAP

## 2022-05-15 ENCOUNTER — Ambulatory Visit (HOSPITAL_COMMUNITY): Payer: Medicare HMO | Attending: Cardiology

## 2022-05-15 ENCOUNTER — Ambulatory Visit (HOSPITAL_COMMUNITY): Payer: Medicare HMO

## 2022-05-15 DIAGNOSIS — I422 Other hypertrophic cardiomyopathy: Secondary | ICD-10-CM | POA: Insufficient documentation

## 2022-05-16 LAB — ECHOCARDIOGRAM STRESS TEST
Area-P 1/2: 3.21 cm2
S' Lateral: 2.3 cm

## 2022-05-18 ENCOUNTER — Telehealth: Payer: Self-pay | Admitting: Internal Medicine

## 2022-05-18 NOTE — Telephone Encounter (Signed)
Patient notified

## 2022-05-18 NOTE — Telephone Encounter (Signed)
Please let the pt know that her thyroid biopsy results are inconclusive ( which means we don't know if they are benign or not yet) , the sample has been sent for further testing and will have to wait additional 3 weeks before we get those results back   Thanks

## 2022-05-30 ENCOUNTER — Telehealth: Payer: Self-pay | Admitting: Internal Medicine

## 2022-05-30 DIAGNOSIS — E042 Nontoxic multinodular goiter: Secondary | ICD-10-CM

## 2022-05-30 NOTE — Telephone Encounter (Signed)
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

## 2022-05-31 ENCOUNTER — Encounter (HOSPITAL_COMMUNITY): Payer: Self-pay

## 2022-06-06 ENCOUNTER — Other Ambulatory Visit: Payer: Self-pay

## 2022-06-06 ENCOUNTER — Emergency Department (HOSPITAL_COMMUNITY)
Admission: EM | Admit: 2022-06-06 | Discharge: 2022-06-06 | Disposition: A | Discharge status: Home / Self Care | Payer: Medicare HMO | Attending: Emergency Medicine | Admitting: Emergency Medicine

## 2022-06-06 ENCOUNTER — Ambulatory Visit (INDEPENDENT_AMBULATORY_CARE_PROVIDER_SITE_OTHER): Payer: Medicare HMO

## 2022-06-06 ENCOUNTER — Other Ambulatory Visit: Payer: Self-pay | Admitting: Cardiology

## 2022-06-06 ENCOUNTER — Emergency Department (HOSPITAL_COMMUNITY): Payer: Medicare HMO

## 2022-06-06 ENCOUNTER — Encounter (HOSPITAL_COMMUNITY): Payer: Self-pay | Admitting: Student

## 2022-06-06 DIAGNOSIS — R002 Palpitations: Secondary | ICD-10-CM | POA: Diagnosis present

## 2022-06-06 DIAGNOSIS — R0602 Shortness of breath: Secondary | ICD-10-CM | POA: Diagnosis not present

## 2022-06-06 DIAGNOSIS — R079 Chest pain, unspecified: Secondary | ICD-10-CM

## 2022-06-06 DIAGNOSIS — I471 Supraventricular tachycardia: Secondary | ICD-10-CM

## 2022-06-06 DIAGNOSIS — I422 Other hypertrophic cardiomyopathy: Secondary | ICD-10-CM | POA: Diagnosis not present

## 2022-06-06 DIAGNOSIS — I493 Ventricular premature depolarization: Secondary | ICD-10-CM | POA: Diagnosis not present

## 2022-06-06 DIAGNOSIS — I10 Essential (primary) hypertension: Secondary | ICD-10-CM | POA: Insufficient documentation

## 2022-06-06 LAB — CBC WITH DIFFERENTIAL/PLATELET
Abs Immature Granulocytes: 0.01 10*3/uL (ref 0.00–0.07)
Basophils Absolute: 0.1 10*3/uL (ref 0.0–0.1)
Basophils Relative: 1 %
Eosinophils Absolute: 0.1 10*3/uL (ref 0.0–0.5)
Eosinophils Relative: 2 %
HCT: 36 % (ref 36.0–46.0)
Hemoglobin: 12.1 g/dL (ref 12.0–15.0)
Immature Granulocytes: 0 %
Lymphocytes Relative: 32 %
Lymphs Abs: 2.1 10*3/uL (ref 0.7–4.0)
MCH: 31.6 pg (ref 26.0–34.0)
MCHC: 33.6 g/dL (ref 30.0–36.0)
MCV: 94 fL (ref 80.0–100.0)
Monocytes Absolute: 0.6 10*3/uL (ref 0.1–1.0)
Monocytes Relative: 9 %
Neutro Abs: 3.7 10*3/uL (ref 1.7–7.7)
Neutrophils Relative %: 56 %
Platelets: 273 10*3/uL (ref 150–400)
RBC: 3.83 MIL/uL — ABNORMAL LOW (ref 3.87–5.11)
RDW: 14.7 % (ref 11.5–15.5)
WBC: 6.6 10*3/uL (ref 4.0–10.5)
nRBC: 0 % (ref 0.0–0.2)

## 2022-06-06 LAB — TSH: TSH: 2.04 u[IU]/mL (ref 0.350–4.500)

## 2022-06-06 LAB — COMPREHENSIVE METABOLIC PANEL
ALT: 17 U/L (ref 0–44)
AST: 20 U/L (ref 15–41)
Albumin: 3.5 g/dL (ref 3.5–5.0)
Alkaline Phosphatase: 96 U/L (ref 38–126)
Anion gap: 7 (ref 5–15)
BUN: 17 mg/dL (ref 8–23)
CO2: 22 mmol/L (ref 22–32)
Calcium: 9.9 mg/dL (ref 8.9–10.3)
Chloride: 109 mmol/L (ref 98–111)
Creatinine, Ser: 0.9 mg/dL (ref 0.44–1.00)
GFR, Estimated: >60 mL/min (ref >60)
Glucose, Bld: 197 mg/dL — ABNORMAL HIGH (ref 70–99)
Potassium: 3.1 mmol/L — ABNORMAL LOW (ref 3.5–5.1)
Sodium: 138 mmol/L (ref 135–145)
Total Bilirubin: 0.5 mg/dL (ref 0.3–1.2)
Total Protein: 7.3 g/dL (ref 6.5–8.1)

## 2022-06-06 LAB — TROPONIN I (HIGH SENSITIVITY)
Troponin I (High Sensitivity): 13 ng/L (ref <18)
Troponin I (High Sensitivity): 12 ng/L (ref <18)

## 2022-06-06 MED ORDER — POTASSIUM CHLORIDE CRYS ER 20 MEQ PO TBCR
40.0000 meq | EXTENDED_RELEASE_TABLET | Freq: Two times a day (BID) | ORAL | Status: AC
  Administered 2022-06-06 (×2): 40 meq via ORAL
  Filled 2022-06-06 (×2): qty 2

## 2022-06-06 MED ORDER — METOPROLOL TARTRATE 25 MG PO TABS: 25.0000 mg | ORAL_TABLET | Freq: Two times a day (BID) | ORAL | Status: DC | PRN

## 2022-06-06 MED ORDER — METOPROLOL TARTRATE 25 MG PO TABS
25.0000 mg | ORAL_TABLET | Freq: Two times a day (BID) | ORAL | 0 refills | Status: AC | PRN
Start: 2022-06-06 — End: ?

## 2022-06-06 NOTE — Discharge Instructions (Signed)
Take metoprolol tartrate as prescribed for palpitations as discussed with cardiology.  They have sent cardiac monitor to your house.  Follow-up with cardiology.  Return if symptoms worsen.

## 2022-06-06 NOTE — Progress Notes (Unsigned)
Enrolled for [employer] to mail a ZIO XT long term holter monitor to the patients address on file.  

## 2022-06-06 NOTE — ED Provider Notes (Signed)
Patient seen by cardiology for her palpitations and chest pain.  History of hypertrophic cardiomyopathy.  They are recommending metoprolol to tartrate as needed as needed for palpitations.  We will arrange for her to get a cardiac monitor and have cleared her for discharge with cardiology follow-up.  Patient comfortable with this plan.  Discharged in good condition.  Understands return precautions.  This chart was dictated using voice recognition software.  Despite best efforts to proofread,  errors can occur which can change the documentation meaning.    Lennice Sites, DO 06/06/22 1119

## 2022-06-06 NOTE — Progress Notes (Signed)
Ordered a 14 day monitor for the evaluation of palpitations. To be read by Dr. Gasper Sells (primary cardiologist)

## 2022-06-06 NOTE — ED Triage Notes (Signed)
Per EMS, pt from home, was woken up w/ palpitations that lasted 15 minutes.  When they resolved she stood up and had mild lightheadedness and SOB.  Only CP when the palpitations return.    324 ASA 20G L AC 132/86 80% 100% RA 16 RR

## 2022-06-06 NOTE — Consult Note (Signed)
Cardiology Consultation:   Patient ID: Grace Dyer MRN: [id number]; DOB: 08/25/1951  Admit date: 06/06/2022 Date of Consult: 06/06/2022  Primary Care Provider: Roselee Nova, MD [facility] Cardiologist: Daisy Floro, MD [facility] Electrophysiologist:  None    Patient Profile:   Grace Dyer is a 71 y.o. female with a hx of HCM (resting LVOT gradient 29, max thickness 19 mm, ~1% LGE using 6 standard deviation approach), palpitations (history of SVT and NSVT), aortic atherosclerosis, vit D deficiency, HTN, multinodular goiter, and dyslipidemia who is being seen today for the evaluation of palpitations at the request of the ED.  History of Present Illness:   [name] is a 70 year old female with above medical history who recently established care with Dr. Gasper Sells.   Regarding her cardiac history, she recently moved full time from [place] to [place] to be closer to her children in retirement. She previously was getting cardiac care from at least 2 different cardiologist in [place].  She has a longstanding history of struggling with palpitations, and during episodes has significant weakness.  Her first echocardiogram was performed Feb 15, 2022, and demonstrated severe asymmetric septal hypertrophy measuring 1.9 cm, along with systolic anterior motion of the mitral valve leaflet and resting gradient of 29 mmHg.  She subsequently wore a Holter monitor, which showed numerous episodes of supraventricular tachycardia and a short episode of NSVT (do not have strips or report).  She has limited her exercise due to feeling of palpitations, sometimes with near syncope.  Has decreased her exercise in order to keep her from having symptoms. For all the above, was referred to the [facility] HOCM clinic with Dr Daisy Floro.  She established with Dr Gasper Sells on 04/30/22, at which point stress echo was ordered, and subsequently performed 05/16/22 which showed  severe inducible HCM gradient. She was referred for family genetic testing screening. Plan to attempt to obtain heart monitor study.  She denies family history of heart disease, denies history of early or sudden cardiac death. However, patient does not know her father and does not know her paternal family history.   Patient presented to the ED on 8/30 complaining of palpitations that lasted for 15 minutes. When the palpitations resolved, she stood up and had mild light headedness and SOB. Labs in the ED showed Na 138, K 3.1, creatinine 0.90, WBC 6.6, hemoglobin 12.1, platelets 273. hsTn 13>>12. TSH within normal limits.   EKG showed normal sinus rhythm, LVH with repolarization abnormality. Chest x-ray showed no evidence of acute cardiopulmonary disease.   On interview, patient reports that she has had a heart murmur for her whole life, however she did not know that she had hypertrophic cardiomyopathy until she moved to [place] earlier this year. She had seen 2 cardiologists in [place], but neither diagnosed with with HCM. She has also had palpitations for several years. Palpitations are usually painful and feel like a pounding in her chest. Not associated with syncope/near syncope, dizziness, sob. She often feels the palpitations when she smells smoke. Palpitations can last up to 15-30 minutes. Sometimes relieved when she sits down and takes deep breaths. She has been told she has hypokalemia before. Recently, she had a thyroid nodule biopsied and there was concern for cancer. She is waiting for a phone call from oncology with further steps.   Yesterday evening, patient was laying in bed and someone was smoking a cigarette outside of her window. She began to feel palpitations. The palpitations felt  similar to palpitations that she had in the past. They lasted for about 15 minutes then resolved. After the palpations went away, she stood up from the side of the bed and felt woozy. She denies syncope/near  syncope, sob. Now in the ER, she has had very brief palpitations but is otherwise in her usual state of health. She denies any recent nausea, vomiting, diarrhea.    Past Medical History:  Diagnosis Date   Aortic atherosclerosis ([place])    Dyslipidemia    Hypertension    Hypertrophic cardiomyopathy ([place])    Palpitations     History reviewed. No pertinent surgical history.   Home Medications:  Prior to Admission medications   Medication Sig Start Date End Date Taking? Authorizing Provider  metoprolol succinate (TOPROL-XL) 100 MG 24 hr tablet Take 1 tablet (100 mg total) by mouth daily. 02/26/22  Yes Hilty, Nadean Corwin, MD  NIFEdipine (ADALAT CC) 60 MG 24 hr tablet Take 1 tablet (60 mg total) by mouth daily. 04/30/22  Yes Chandrasekhar, Mahesh A, MD  rosuvastatin (CRESTOR) 10 MG tablet Take 10 mg by mouth at bedtime. 10/24/21  Yes [provider]  traMADol (ULTRAM-ER) 100 MG 24 hr tablet Take 150 mg by mouth daily.   Yes [provider]    Inpatient Medications: Scheduled Meds:  Continuous Infusions:  PRN Meds:   Allergies:    Allergies  Allergen Reactions   Codeine Other (See Comments)    DIZZINESS    Social History:   Social History   Socioeconomic History   Marital status: Widowed    Spouse name: Not on file   Number of children: Not on file   Years of education: Not on file   Highest education level: Not on file  Occupational History   Not on file  Tobacco Use   Smoking status: Never    Passive exposure: Never   Smokeless tobacco: Never  Substance and Sexual Activity   Alcohol use: Not on file   Drug use: Not on file   Sexual activity: Not on file  Other Topics Concern   Not on file  Social History Narrative   Not on file   Social Determinants of Health   Financial Resource Strain: Not on file  Food Insecurity: Not on file  Transportation Needs: Not on file  Physical Activity: Not on file  Stress: Not on file  Social Connections: Not on  file  Intimate Partner Violence: Not on file    Family History:   Notable family events include No HCM history; GMA died at 56, Mother died at 61 of unclear causes (didn't talk about her medical history) four kids and 12 grandchildren.History reviewed. No pertinent family history.   ROS:  Review of Systems: [y] = yes, '[ ]'$  = no      General: Weight gain '[ ]'$ ; Weight loss '[ ]'$ ; Anorexia '[ ]'$ ; Fatigue '[ ]'$ ; Fever '[ ]'$ ; Chills '[ ]'$ ; Weakness '[ ]'$    Cardiac: Chest pain/pressure '[ ]'$ ; Resting SOB '[ ]'$ ; Exertional SOB '[ ]'$ ; Orthopnea '[ ]'$ ; Pedal Edema '[ ]'$ ; Palpitations '[ ]'$ ; Syncope '[ ]'$ ; Presyncope '[ ]'$ ; Paroxysmal nocturnal dyspnea '[ ]'$    Pulmonary: Cough '[ ]'$ ; Wheezing '[ ]'$ ; Hemoptysis '[ ]'$ ; Sputum '[ ]'$ ; Snoring '[ ]'$    GI: Vomiting '[ ]'$ ; Dysphagia '[ ]'$ ; Melena '[ ]'$ ; Hematochezia '[ ]'$ ; Heartburn '[ ]'$ ; Abdominal pain '[ ]'$ ; Constipation '[ ]'$ ; Diarrhea '[ ]'$ ; BRBPR '[ ]'$    GU: Hematuria '[ ]'$ ; Dysuria '[ ]'$ ;  Nocturia '[ ]'$  Vascular: Pain in legs with walking '[ ]'$ ; Pain in feet with lying flat '[ ]'$ ; Non-healing sores '[ ]'$ ; Stroke '[ ]'$ ; TIA '[ ]'$ ; Slurred speech '[ ]'$ ;   Neuro: Headaches '[ ]'$ ; Vertigo '[ ]'$ ; Seizures '[ ]'$ ; Paresthesias '[ ]'$ ;Blurred vision '[ ]'$ ; Diplopia '[ ]'$ ; Vision changes '[ ]'$    Ortho/Skin: Arthritis '[ ]'$ ; Joint pain '[ ]'$ ; Muscle pain '[ ]'$ ; Joint swelling '[ ]'$ ; Back Pain '[ ]'$ ; Rash '[ ]'$    Psych: Depression '[ ]'$ ; Anxiety '[ ]'$    Heme: Bleeding problems '[ ]'$ ; Clotting disorders '[ ]'$ ; Anemia '[ ]'$    Endocrine: Diabetes '[ ]'$ ; Thyroid dysfunction '[ ]'$    Physical Exam/Data:   Vitals:   06/06/22 0600 06/06/22 0700 06/06/22 0800 06/06/22 0801  BP: (!) 145/101 139/62 (!) 146/92   Pulse: 67 62 (!) 58   Resp: 18 17 (!) 23   Temp:    98.5 F (36.9 C)  TempSrc:    Oral  SpO2: 94% 94% 97%    No intake or output data in the 24 hours ending 06/06/22 0829    04/30/2022    9:31 AM 04/25/2022    9:22 AM 03/16/2022    5:39 PM  Last 3 Weights  Weight (lbs) 162 lb 161 lb 6.4 oz 167 lb  Weight (kg) 73.483 kg 73.211 kg 75.751 kg     There is no height or  weight on file to calculate BMI.  General:  Well nourished, well developed, in no acute distress. Resting comfortably in the bed HEENT: Normal  Neck: no JVD Endocrine:  Mild swelling over the right thyroid, nontender to palpation  Vascular: No carotid bruits; Radial pulses 2+ bilaterally Cardiac:  normal S1, S2; RRR; grade 3/6 systolic murmur throughout with radiation to the carotids  Lungs:  clear to auscultation bilaterally, no wheezing, rhonchi or rales. Normal WOB on room air  Abd: soft, nontender Ext: no edema Musculoskeletal:  No deformities, BUE and BLE strength normal and equal Skin: warm and dry  Neuro:  CNs 2-12 intact, no focal abnormalities noted Psych:  Normal affect   EKG:  The EKG was personally reviewed and demonstrates:  NSR, LVH Telemetry:  Telemetry was personally reviewed and demonstrates:  Sinus rhythm, infrequent PVCs   Relevant CV Studies: cMRI (04/19/22): IMPRESSION: 1. Morphologic findings consistent with hypertrophic cardiomyopathy with asymmetric septal hypertrophy 19 mm, SAM with LVOT turbulence and MR. 2. Diffuse mid/subepicardial gadolinium uptake and diffusely elevated T1/ECV suggest possibility of amyloid masquerading as HOCM 3.  Severe LAE 4.  Mild/Moderate circumferential pericardial effusion 5. Suggest f/u Echo with strain imaging and Tc Pyrophosphate scan to further investigate  Echo Stress Test (05/16/22): IMPRESSIONS   1. This is an inconclusive stress echocardiogram for ischemia.   2. This is an indeterminate risk study for ischemia (study done for  inducivle LVOT gradient).   3. At rest, hypderynamie LV with LVEF 70%. Septal thickness 26 mm.   4. There is resting systolic anterior motion of the mitral valve and  associated mitral regurgitation. There is notable contimation of the LVOT  gradient by mitral valve signal   5. Back calculation of gradient using mitral regurgitaiton method shows a  resting gradient of 29 mm Hg but an inducible  gradient of 90 mm Hg.   6. 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   7. Trivial pericardial effusion without tamponade.   8. Left Atrial dilation.   Laboratory Data:  High Sensitivity Troponin:  Recent Labs  Lab 06/06/22 0038 06/06/22 0536  TROPONINIHS 13 12     Chemistry Recent Labs  Lab 06/06/22 0038  NA 138  K 3.1*  CL 109  CO2 22  GLUCOSE 197*  BUN 17  CREATININE 0.90  CALCIUM 9.9  GFRNONAA >60  ANIONGAP 7    Recent Labs  Lab 06/06/22 0038  PROT 7.3  ALBUMIN 3.5  AST 20  ALT 17  ALKPHOS 96  BILITOT 0.5   Hematology Recent Labs  Lab 06/06/22 0038  WBC 6.6  RBC 3.83*  HGB 12.1  HCT 36.0  MCV 94.0  MCH 31.6  MCHC 33.6  RDW 14.7  PLT 273   BNPNo results for input(s): "BNP", "PROBNP" in the last 168 hours.  DDimer No results for input(s): "DDIMER" in the last 168 hours.  Radiology/Studies:  DG Chest 2 View  Result Date: 06/06/2022 CLINICAL DATA:  Shortness of breath, cough, palpitations EXAM: CHEST - 2 VIEW COMPARISON:  03/16/2022 FINDINGS: Mild bibasilar atelectasis. Lungs are essentially clear. No pleural effusion or pneumothorax. Heart is normal in size.  Thoracic aortic atherosclerosis. Mild degenerative changes of the visualized thoracolumbar spine. IMPRESSION: No evidence of acute cardiopulmonary disease. Electronically Signed   By: Julian Hy M.D.   On: 06/06/2022 00:56   {  Assessment and Plan:   Tamika Shropshire is a 71 y.o. female with a hx of HCM (resting LVOT gradient 29, max thickness 19 mm, ~1% LGE using 6 standard deviation approach), palpitations (SVT and NSVT), aortic atherosclerosis, vit D deficiency, HTN, multinodular goiter, and dyslipidemia who is being seen today for the evaluation of palpitations at the request of the ED.  Palpitations - Patient has along standing history of palpitations. Often occur when she smells smoke (most commonly cigarette smoke). Palpitations are associated with some chest pain that  feels like pounding. Not associated with SOB, dizziness, syncope/near syncope. Can last 15-30 minutes and are relieved with deep breaths. Yesterday evening, patient had a similar episode.  - Previously wore a cardiac monitor in May 2023 that showed multiple episodes of SVT and one episode of NSVT. Monitor ordered by an outside provider, we are unable to review report directly. Dr. Gasper Sells planned to obtain an original monitor study  - EKG in the ED showed NSR with LVH. Telemetry shows normal sinus with infrequent PVCs  - I have arranged for an outpatient monitor. Patient understands that the monitor will be mailed to her home. Her daughter can help her place the monitor once it arrives  - Continue metoprolol succinate 100 mg daily  - Add PRN metoprolol tartrate 25 mg for palpitations   HCM - Patient followed by Dr. Gasper Sells - Cardiac MRI from 04/19/2022 showed morphologic findings consistent with hypertrophic cardiomyopathy with asymmetric septal hypertrophy 51m, SAM with LVOT turbulence and MR. Minimal LGE using 6 standard deviation method - Stress echocardiogram from 05/16/2022 was suggestive of a severe inducible LVOT gradient - Patient understands that she should avoid strenuous activities. Reports that she is not very active due to knee pain  - Patient has already been referred for genetic testing, has an appointment on 10/16/2022.  - Continue metoprolol succinate 100 mg daily - Patient prefers nifedipine for BP control. (Although diltiazem or verapamil are better choices. Patient also declined aldactone in the past)  - Patient has a follow up appointment with Dr. CGasper Sellson 10/24   Hypokalemia  - K 3.1 on presentation, I have ordered 2 doses of 40 mEq supplementation - Follow up  with PCP for monitoring.     MD to see   For questions or updates, please contact [place] Please consult [email or web address] for contact info under     Signed, Margie Billet,  PA-C  06/06/2022 8:29 AM

## 2022-06-06 NOTE — ED Provider Notes (Signed)
[facility] Emergency Department Provider Note MRN:  [id number]  Arrival date & time: 06/06/22     Chief Complaint   Palpitations and Shortness of Breath   History of Present Illness   Grace Dyer is a 71 y.o. year-old female with a history of HCM presenting to the ED with chief complaint of palpitations.  Palpitations for the past 4 or 5 hours, more severe and painful than normal.  Chest pain and shortness of breath along with the palpitations.  Was told by cardiologist to come to the emergency department if her palpitations returned.  History of hypertrophic cardiomyopathy.  Denies fever or cough, no leg pain or swelling, no other complaints.  Palpitations still present at this time but more mild.  Review of Systems  A thorough review of systems was obtained and all systems are negative except as noted in the HPI and PMH.   Patient's Health History    Past Medical History:  Diagnosis Date   Aortic atherosclerosis ([place])    Dyslipidemia    Hypertension    Hypertrophic cardiomyopathy ([place])    Palpitations     History reviewed. No pertinent surgical history.  History reviewed. No pertinent family history.  Social History   Socioeconomic History   Marital status: Widowed    Spouse name: Not on file   Number of children: Not on file   Years of education: Not on file   Highest education level: Not on file  Occupational History   Not on file  Tobacco Use   Smoking status: Never    Passive exposure: Never   Smokeless tobacco: Never  Substance and Sexual Activity   Alcohol use: Not on file   Drug use: Not on file   Sexual activity: Not on file  Other Topics Concern   Not on file  Social History Narrative   Not on file   Social Determinants of Health   Financial Resource Strain: Not on file  Food Insecurity: Not on file  Transportation Needs: Not on file  Physical Activity: Not on file  Stress: Not on file  Social Connections: Not on  file  Intimate Partner Violence: Not on file     Physical Exam   Vitals:   06/06/22 0600 06/06/22 0700  BP: (!) 145/101 139/62  Pulse: 67 62  Resp: 18 17  Temp:    SpO2: 94% 94%    CONSTITUTIONAL: Well-appearing, NAD NEURO/PSYCH:  Alert and oriented x 3, no focal deficits EYES:  eyes equal and reactive ENT/NECK:  no LAD, no JVD CARDIO: Regular rate, well-perfused, normal S1 and S2 PULM:  CTAB no wheezing or rhonchi GI/GU:  non-distended, non-tender MSK/SPINE:  No gross deformities, no edema SKIN:  no rash, atraumatic   *Additional and/or pertinent findings included in MDM below  Diagnostic and Interventional Summary    EKG Interpretation  Date/Time:  Wednesday June 06 2022 00:34:23 EDT Ventricular Rate:  81 PR Interval:  152 QRS Duration: 100 QT Interval:  380 QTC Calculation: 441 R Axis:   -1 Text Interpretation: Normal sinus rhythm Left ventricular hypertrophy with repolarization abnormality ( R in aVL , Cornell product ) Abnormal ECG When compared with ECG of 16-Mar-2022 17:43, PREVIOUS ECG IS PRESENT Confirmed by Gerlene Fee [phone]) on 06/06/2022 4:51:16 AM       Labs Reviewed  CBC WITH DIFFERENTIAL/PLATELET - Abnormal; Notable for the following components:      Result Value   RBC 3.83 (*)    All other components within  normal limits  COMPREHENSIVE METABOLIC PANEL - Abnormal; Notable for the following components:   Potassium 3.1 (*)    Glucose, Bld 197 (*)    All other components within normal limits  TSH  TROPONIN I (HIGH SENSITIVITY)  TROPONIN I (HIGH SENSITIVITY)    DG Chest 2 View  Final Result      Medications - No data to display   Procedures  /  Critical Care Procedures  ED Course and Medical Decision Making  Initial Impression and Ddx Hypertrophic cardiomyopathy here with chest pain, palpitations.  According to the chart review cardiology is considering ICD placement, will consult cardiology this morning giving the worsening  symptoms.  Past medical/surgical history that increases complexity of ED encounter: HCM  Interpretation of Diagnostics I personally reviewed the EKG and my interpretation is as follows: Sinus rhythm, no significant change from prior  Labs reassuring without significant blood count or electrolyte disturbance, troponin negative.  Patient Reassessment and Ultimate Disposition/Management Clinical Course as of 06/06/22 0727  Wed Jun 06, 2022  0624 Case discussed with cardiology, who will formally consult and admission is suspected according to the fellow. [MB]    Clinical Course User Index [MB] Maudie Flakes, MD     Send out to oncoming provider.  Patient management required discussion with the following services or consulting groups:  Cardiology  Complexity of Problems Addressed Acute illness or injury that poses threat of life of bodily function  Additional Data Reviewed and Analyzed Further history obtained from: Recent Consult notes and Prior labs/imaging results  Additional Factors Impacting ED Encounter Risk Consideration of hospitalization  Barth Kirks. Sedonia Small, MD [place] [email or web address]  Final Clinical Impressions(s) / ED Diagnoses     ICD-10-CM   1. Palpitations  R00.2     2. Chest pain, unspecified type  R07.9       ED Discharge Orders     None        Discharge Instructions Discussed with and Provided to Patient:   Discharge Instructions   None      Maudie Flakes, MD 06/06/22 [phone]

## 2022-06-06 NOTE — ED Provider Triage Note (Signed)
Emergency Medicine Provider Triage Evaluation Note  Grace Dyer , a 71 y.o. female  was evaluated in triage.  Pt complains of palpitations that lasted about 15 minutes, resolved, stood up and started to feel short of breath/lightheaded. Since then has had intermittent palpitations w/ dyspnea and chest discomfort, no alleviating/aggravating factors. She thinks sxs may have been triggered by someone smoking outside her house as she has had these types of sxs when around smoke or incents before  Sinus on the monitor w/ EMS. Received aspirin.    Review of Systems  Per above   Physical Exam  BP 124/66 (BP Location: Right Arm)   Pulse 78   Temp 98.5 F (36.9 C) (Oral)   Resp 15   SpO2 98%  Gen:   Awake, no distress   Resp:  Normal effort  MSK:   Moves extremities without difficulty  Other:  Regular rate/rhythm, murmur present.   Medical Decision Making  Medically screening exam initiated at 12:30 AM.  Appropriate orders placed.  Grace Dyer was informed that the remainder of the evaluation will be completed by another provider, this initial triage assessment does not replace that evaluation, and the importance of remaining in the ED until their evaluation is complete.  Palpitations   Amaryllis Dyke, PA-C 06/06/22 0037

## 2022-06-06 NOTE — ED Notes (Signed)
Pt ambulatory to the bathroom without assistance.  

## 2022-06-07 DIAGNOSIS — R002 Palpitations: Secondary | ICD-10-CM | POA: Diagnosis not present

## 2022-07-17 ENCOUNTER — Encounter: Payer: Medicare HMO | Admitting: Genetic Counselor

## 2022-07-30 NOTE — Progress Notes (Signed)
Cardiology Office Note:    Date:  07/31/2022   ID:  Grace Dyer, DOB 1951-09-19, MRN [id number]  PCP:  Roselee Nova, MD   [place] Providers Cardiologist:  None     Referring MD: Roselee Nova, MD   CC: HCM  History of Present Illness:    Grace Dyer is a 71 y.o. female with a hx of HCM (resting LVOT gradient 29, max thickness 19 mm, ~1% LGE using 6 standard deviation approach).   August found to have an inducible gradient of 70 mm Hg.  Doing OK.   Has a thyroid nodule in the beginning of the November and may need FNA vs surgery. Has genetic testing appt in the beginning of the year. Notes that she is having no DOE. Notes fatigue. Notes no palpitations Notes no CP. Notes no Dizziness. Notes no syncope. Notable family events include Four kids, 12 grandchildren.  No new issues with SCD; mother died at 73 of unclear causes.. She thinks that she needs a lot of vitamins to help jumpstart her body.   Past Medical History:  Diagnosis Date   Aortic atherosclerosis ([place])    Dyslipidemia    Hypertension    Hypertrophic cardiomyopathy ([place])    Palpitations     No past surgical history on file.  Current Medications: Current Meds  Medication Sig   HYDROcodone-acetaminophen (NORCO/VICODIN) 5-325 MG tablet Take 1 tablet by mouth 3 (three) times daily as needed.   metoprolol succinate (TOPROL-XL) 100 MG 24 hr tablet Take 1 tablet (100 mg total) by mouth daily.   metoprolol tartrate (LOPRESSOR) 25 MG tablet Take 1 tablet (25 mg total) by mouth 2 (two) times daily as needed for up to 30 doses (palpitations).   NIFEdipine (ADALAT CC) 60 MG 24 hr tablet Take 1 tablet (60 mg total) by mouth daily.   rosuvastatin (CRESTOR) 10 MG tablet Take 10 mg by mouth at bedtime.     Allergies:   Codeine   Social History   Socioeconomic History   Marital status: Widowed    Spouse name: Not on file   Number of children: Not on file   Years of education: Not  on file   Highest education level: Not on file  Occupational History   Not on file  Tobacco Use   Smoking status: Never    Passive exposure: Never   Smokeless tobacco: Never  Substance and Sexual Activity   Alcohol use: Not on file   Drug use: Not on file   Sexual activity: Not on file  Other Topics Concern   Not on file  Social History Narrative   Not on file   Social Determinants of Health   Financial Resource Strain: Not on file  Food Insecurity: Not on file  Transportation Needs: Not on file  Physical Activity: Not on file  Stress: Not on file  Social Connections: Not on file    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     All other systems reviewed and are negative.  EKGs/Labs/Other Studies Reviewed:     Recent Labs: 06/06/2022: ALT 17; BUN 17; Creatinine, Ser 0.90; Hemoglobin 12.1; Platelets 273; Potassium 3.1; Sodium 138; TSH 2.040  Recent Lipid Panel No results found for: "CHOL", "TRIG", "HDL", "CHOLHDL", "VLDL", "LDLCALC", "LDLDIRECT"      Physical Exam:    VS:  BP (!) 114/58  Pulse 62   Ht '5\' 3"'$  (1.6 m)   Wt 162 lb (73.5 kg)   SpO2 97%   BMI 28.70 kg/m     Wt Readings from Last 3 Encounters:  07/31/22 162 lb (73.5 kg)  04/30/22 162 lb (73.5 kg)  04/25/22 161 lb 6.4 oz (73.2 kg)    GEN:  Well nourished, well developed in no acute distress HEENT: Normal NECK: No JVD LYMPHATICS: No lymphadenopathy CARDIAC: regular bradycardia with systolic murmur while standing and with hand grip, no rubs, gallops RESPIRATORY:  Clear to auscultation without rales, wheezing or rhonchi  ABDOMEN: Soft, non-tender, non-distended MUSCULOSKELETAL:  No edema; No deformity  SKIN: Warm and dry NEUROLOGIC:  Alert and oriented x 3 PSYCHIATRIC:  Normal affect   ASSESSMENT:    1. Hypertrophic cardiomyopathy ([place])     PLAN:    Hypertrophic Cardiomyopathy - Septal  Variant - Inducible gradient of 90 mm Hg - NYHA II  - Family history with no prior HCM or SCD, Discussed family screening  (Genetic eval 10/2022)  CMR notable thickness, minimal LGE using 6 standard deviation method - no ectopy of stress testing; will offer zio patch in 2024  Patient is still symptomatic.  Unable to further titrate AV nodal agents. Using SDM, we discussed CMIs (would need to come of nifedipine), ASA (would need LHC, available in [facility], [place], and [place]; she has a friend that had one in [place]), or SRT (no concomitant surgical indications).  She is considering either CMI's or SRT.  She would like to review the SDM with her family and reach out to use next week ~ Halloween.  Continue succinate 100 mg Daily  Planned for 17 week f/u and for CMI start unless   Time Spent Directly with Patient:   I have spent a total of 50 with the patient reviewing notes, imaging, EKGs, labs and examining the patient as well as establishing an assessment and plan that was discussed personally with the patient.  > 50% of time was spent in direct patient care reviewing imaging with patient (stress Echo).   Medication Adjustments/Labs and Tests Ordered: Current medicines are reviewed at length with the patient today.  Concerns regarding medicines are outlined above.  No orders of the defined types were placed in this encounter.  No orders of the defined types were placed in this encounter.   Patient Instructions  Medication Instructions:  Your physician recommends that you continue on your current medications as directed. Please refer to the Current Medication list given to you today. We discussed starting mavacamten (Camzyos), you signed paperwork and given patient brochure  *If you need a refill on your cardiac medications before your next appointment, please call your pharmacy*   Lab Work: NONE If you have labs (blood work) drawn today and your tests are completely normal, you will  receive your results only by: [place] (if you have MyChart) OR A paper copy in the mail If you have any lab test that is abnormal or we need to change your treatment, we will call you to review the results.   Testing/Procedures: NONE   Follow-Up: At [facility], you and your health needs are our priority.  As part of our continuing mission to provide you with exceptional heart care, we have created designated Provider Care Teams.  These Care Teams include your primary Cardiologist (physician) and Advanced Practice Providers (APPs -  Physician Assistants and Nurse Practitioners) who all work together to provide you with the care  you need, when you need it.  We recommend signing up for the patient portal called "MyChart".  Sign up information is provided on this After Visit Summary.  MyChart is used to connect with patients for Virtual Visits (Telemedicine).  Patients are able to view lab/test results, encounter notes, upcoming appointments, etc.  Non-urgent messages can be sent to your provider as well.   To learn more about what you can do with MyChart, go to [email or web address].    Your next appointment:   17 week(s)  The format for your next appointment:   In Person  Provider:   Rudean Haskell, MD   Important Information About Sugar         Signed, Werner Lean, MD  07/31/2022 10:46 AM    [place]

## 2022-07-31 ENCOUNTER — Encounter: Payer: Self-pay | Admitting: Internal Medicine

## 2022-07-31 ENCOUNTER — Ambulatory Visit: Payer: Medicare HMO | Attending: Internal Medicine | Admitting: Internal Medicine

## 2022-07-31 VITALS — BP 114/58 | HR 62 | Ht 63.0 in | Wt 162.0 lb

## 2022-07-31 DIAGNOSIS — I422 Other hypertrophic cardiomyopathy: Secondary | ICD-10-CM

## 2022-07-31 NOTE — Patient Instructions (Signed)
Medication Instructions:  Your physician recommends that you continue on your current medications as directed. Please refer to the Current Medication list given to you today. We discussed starting mavacamten (Camzyos), you signed paperwork and given patient brochure  *If you need a refill on your cardiac medications before your next appointment, please call your pharmacy*   Lab Work: NONE If you have labs (blood work) drawn today and your tests are completely normal, you will receive your results only by: [place] (if you have MyChart) OR A paper copy in the mail If you have any lab test that is abnormal or we need to change your treatment, we will call you to review the results.   Testing/Procedures: NONE   Follow-Up: At [facility], you and your health needs are our priority.  As part of our continuing mission to provide you with exceptional heart care, we have created designated Provider Care Teams.  These Care Teams include your primary Cardiologist (physician) and Advanced Practice Providers (APPs -  Physician Assistants and Nurse Practitioners) who all work together to provide you with the care you need, when you need it.  We recommend signing up for the patient portal called "MyChart".  Sign up information is provided on this After Visit Summary.  MyChart is used to connect with patients for Virtual Visits (Telemedicine).  Patients are able to view lab/test results, encounter notes, upcoming appointments, etc.  Non-urgent messages can be sent to your provider as well.   To learn more about what you can do with MyChart, go to [email or web address].    Your next appointment:   17 week(s)  The format for your next appointment:   In Person  Provider:   Rudean Haskell, MD   Important Information About Sugar

## 2022-08-07 ENCOUNTER — Telehealth: Payer: Self-pay | Admitting: Internal Medicine

## 2022-08-07 NOTE — Telephone Encounter (Signed)
  Pt said, she will take all her medications first before considering any procedure. She would like for Dr. Gasper Sells know

## 2022-08-07 NOTE — Telephone Encounter (Signed)
Pt made aware nurse is out of the office this week and will be addressed when she returns. Aware I would still send this to Dr. Gasper Sells for his FYI. Pt agreeable to plan.

## 2022-08-08 ENCOUNTER — Ambulatory Visit: Payer: Self-pay | Admitting: Surgery

## 2022-08-21 ENCOUNTER — Encounter: Payer: Medicare HMO | Admitting: Genetic Counselor

## 2022-09-07 ENCOUNTER — Encounter (HOSPITAL_COMMUNITY): Admission: RE | Admit: 2022-09-07 | Payer: Medicare HMO | Source: Ambulatory Visit

## 2022-10-04 NOTE — Patient Instructions (Signed)
DUE TO COVID-19 ONLY TWO VISITORS  (aged 71 and older)  ARE ALLOWED TO COME WITH YOU AND STAY IN THE WAITING ROOM ONLY DURING PRE OP AND PROCEDURE.   **NO VISITORS ARE ALLOWED IN THE SHORT STAY AREA OR RECOVERY ROOM!!**  IF YOU WILL BE ADMITTED INTO THE HOSPITAL YOU ARE ALLOWED ONLY FOUR SUPPORT PEOPLE DURING VISITATION HOURS ONLY (7 AM -8PM)   The support person(s) must pass our screening, gel in and out, and wear a mask at all times, including in the patient's room. Patients must also wear a mask when staff or their support person are in the room. Visitors GUEST BADGE MUST BE WORN VISIBLY  One adult visitor may remain with you overnight and MUST be in the room by 8 P.M.     Your procedure is scheduled on: 10/15/21   Report to [facility] Main Entrance    Report to admitting at 5:15 AM   Call this number if you have problems the morning of surgery [phone]   Do not eat food :After Midnight.   After Midnight you may have the following liquids until _4:30_____ AM/  DAY OF SURGERY  Water Black Coffee (sugar ok, NO MILK/CREAM OR CREAMERS)  Tea (sugar ok, NO MILK/CREAM OR CREAMERS) regular and decaf                             Plain Jell-O (NO RED)                                           Fruit ices (not with fruit pulp, NO RED)                                     Popsicles (NO RED)                                                                  Juice: apple, WHITE grape, WHITE cranberry Sports drinks like Gatorade (NO RED)                   If you have questions, please contact your surgeon's office.    Oral Hygiene is also important to reduce your risk of infection.                                    Remember - BRUSH YOUR TEETH THE MORNING OF SURGERY WITH YOUR REGULAR TOOTHPASTE  DENTURES WILL BE REMOVED PRIOR TO SURGERY PLEASE DO NOT APPLY "Poly grip" OR ADHESIVES!!!   Do NOT smoke after Midnight   Take these medicines the morning of surgery with A SIP OF WATER:  Metoprolol, Nifedipine-Adalat   Bring CPAP mask and tubing day of surgery.                              You may not have any metal on your body including hair pins, jewelry,  and body piercing             Do not wear make-up, lotions, powders, perfumes, or deodorant  Do not wear nail polish including gel and S&S, artificial/acrylic nails, or any other type of covering on natural nails including finger and toenails. If you have artificial nails, gel coating, etc. that needs to be removed by a nail salon please have this removed prior to surgery or surgery may need to be canceled/ delayed if the surgeon/ anesthesia feels like they are unable to be safely monitored.   Do not shave  48 hours prior to surgery.     Do not bring valuables to the hospital. [place].   Contacts, glasses, or bridgework may not be worn into surgery.   Bring small overnight bag day of surgery.   DO NOT [place].      Special Instructions: Bring a copy of your healthcare power of attorney and living will documents the day of surgery if you haven't scanned them before.              Please read over the following fact sheets you were given: IF YOU HAVE QUESTIONS ABOUT YOUR PRE-OP INSTRUCTIONS PLEASE CALL [phone]    [facility] - Preparing for Surgery Before surgery, you can play an important role.  Because skin is not sterile, your skin needs to be as free of germs as possible.  You can reduce the number of germs on your skin by washing with CHG (chlorahexidine gluconate) soap before surgery.  CHG is an antiseptic cleaner which kills germs and bonds with the skin to continue killing germs even after washing. Please DO NOT use if you have an allergy to CHG or antibacterial soaps.  If your skin becomes reddened/irritated stop using the CHG and inform your nurse when you arrive at Short Stay. Do not shave (including legs and  underarms) for at least 48 hours prior to the first CHG shower.   Please follow these instructions carefully:  1.  Shower with CHG Soap the night before surgery and the  morning of Surgery.  2.  If you choose to wash your hair, wash your hair first as usual with your  normal  shampoo.  3.  After you shampoo, rinse your hair and body thoroughly to remove the  shampoo.                            4.  Use CHG as you would any other liquid soap.  You can apply chg directly  to the skin and wash                       Gently with a scrungie or clean washcloth.  5.  Apply the CHG Soap to your body ONLY FROM THE NECK DOWN.   Do not use on face/ open                           Wound or open sores. Avoid contact with eyes, ears mouth and genitals (private parts).                       Wash face,  Genitals (private parts) with your normal soap.  6.  Wash thoroughly, paying special attention to the area where your surgery  will be performed.  7.  Thoroughly rinse your body with warm water from the neck down.  8.  DO NOT shower/wash with your normal soap after using and rinsing off  the CHG Soap.             9.  Pat yourself dry with a clean towel.            10.  Wear clean pajamas.            11.  Place clean sheets on your bed the night of your first shower and do not  sleep with pets. Day of Surgery : Do not apply any lotions/deodorants the morning of surgery.  Please wear clean clothes to the hospital/surgery center.  FAILURE TO FOLLOW THESE INSTRUCTIONS MAY RESULT IN THE CANCELLATION OF YOUR SURGERY     ________________________________________________________________________

## 2022-10-12 ENCOUNTER — Encounter (HOSPITAL_COMMUNITY): Payer: Self-pay

## 2022-10-12 ENCOUNTER — Other Ambulatory Visit: Payer: Self-pay

## 2022-10-12 ENCOUNTER — Ambulatory Visit (HOSPITAL_COMMUNITY)
Admission: RE | Admit: 2022-10-12 | Discharge: 2022-10-12 | Disposition: A | Discharge status: Home / Self Care | Payer: Medicare HMO | Source: Ambulatory Visit | Attending: Anesthesiology | Admitting: Anesthesiology

## 2022-10-12 ENCOUNTER — Encounter (HOSPITAL_COMMUNITY)
Admission: RE | Admit: 2022-10-12 | Discharge: 2022-10-12 | Disposition: A | Discharge status: Home / Self Care | Payer: Medicare HMO | Source: Ambulatory Visit | Attending: Surgery | Admitting: Surgery

## 2022-10-12 ENCOUNTER — Encounter (HOSPITAL_COMMUNITY): Payer: Self-pay | Admitting: Surgery

## 2022-10-12 DIAGNOSIS — Z01818 Encounter for other preprocedural examination: Secondary | ICD-10-CM | POA: Insufficient documentation

## 2022-10-12 DIAGNOSIS — D440 Neoplasm of uncertain behavior of thyroid gland: Secondary | ICD-10-CM | POA: Diagnosis present

## 2022-10-12 DIAGNOSIS — E042 Nontoxic multinodular goiter: Secondary | ICD-10-CM | POA: Insufficient documentation

## 2022-10-12 HISTORY — DX: Sciatica, unspecified side: M54.30

## 2022-10-12 HISTORY — DX: Scoliosis, unspecified: M41.9

## 2022-10-12 HISTORY — DX: Malignant (primary) neoplasm, unspecified: C80.1

## 2022-10-12 HISTORY — DX: Age-related osteoporosis without current pathological fracture: M81.0

## 2022-10-12 HISTORY — DX: Cardiac murmur, unspecified: R01.1

## 2022-10-12 HISTORY — DX: Dyspnea, unspecified: R06.00

## 2022-10-12 LAB — CBC
HCT: 37.6 % (ref 36.0–46.0)
Hemoglobin: 12.3 g/dL (ref 12.0–15.0)
MCH: 31.5 pg (ref 26.0–34.0)
MCHC: 32.7 g/dL (ref 30.0–36.0)
MCV: 96.4 fL (ref 80.0–100.0)
Platelets: 214 10*3/uL (ref 150–400)
RBC: 3.9 MIL/uL (ref 3.87–5.11)
RDW: 14.3 % (ref 11.5–15.5)
WBC: 5.8 10*3/uL (ref 4.0–10.5)
nRBC: 0 % (ref 0.0–0.2)

## 2022-10-12 LAB — BASIC METABOLIC PANEL
Anion gap: 7 (ref 5–15)
BUN: 15 mg/dL (ref 8–23)
CO2: 26 mmol/L (ref 22–32)
Calcium: 10.5 mg/dL — ABNORMAL HIGH (ref 8.9–10.3)
Chloride: 106 mmol/L (ref 98–111)
Creatinine, Ser: 0.81 mg/dL (ref 0.44–1.00)
GFR, Estimated: >60 mL/min (ref >60)
Glucose, Bld: 86 mg/dL (ref 70–99)
Potassium: 4.6 mmol/L (ref 3.5–5.1)
Sodium: 139 mmol/L (ref 135–145)

## 2022-10-12 NOTE — Progress Notes (Signed)
Anesthesia note:  Bowel prep reminder:  NA  PCP - Dr. Manuella Ghazi Cardiologist -Dr. Gasper Sells Other- endocrinologist- Dr. Dr. Leone Payor  Chest x-ray - 10/12/22 EKG - 06/06/22-epic Stress Test - 05/16/22-epic ECHO - 05/15/22-epic Cardiac Cath - no CABG-no Pacemaker/ICD device last checked:NA  Sleep Study - no CPAP -   Pt is pre diabetic-no CBG at PAT visit- Fasting Blood Sugar at home- Checks Blood Sugar _____  Blood Thinner:no Blood Thinner Instructions: Aspirin Instructions: Last Dose:  Anesthesia review: Yes  reason:cardiac  Patient denies shortness of breath, fever, cough and chest pain at PAT appointment. Pt sometimes gets SOB due to HCM and palpitations but not usually.   Patient verbalized understanding of instructions that were given to them at the PAT appointment. Patient was also instructed that they will need to review over the PAT instructions again at home before surgery.yes

## 2022-10-12 NOTE — Progress Notes (Signed)
Anesthesia Chart Review   Case: [id number] Date/Time: 10/15/22 0715   Procedure: TOTAL THYROIDECTOMY   Anesthesia type: General   Pre-op diagnosis: THYROID NEOPLASM OF UNCERTAIN BEHAVIOR, MULTIPLE THYROID NODULES   Location: [facility] ROOM 02 / [facility]   Surgeons: Armandina Gemma, MD       DISCUSSION:72 y.o. former smoker with h/o HTN, hypertrophic cardiomyopathy, thyroid neoplasm scheduled for above procedure 10/15/2022 with Dr. Armandina Gemma.   Follows with cardiology for hypertrophic cardiomyopathy.  Last seen 07/31/2022. Per OV note pt asymptomatic.   Inducible gradient HCM.  Discussed with Dr. Gasper Sells.  Very low risk of post of heart attack low.  She is going to have some SVT.  Probably .  Recommend fluids, perioperative BB, and now using a lot of ionotropes. Anesthesia VS: There were no vitals taken for this visit.  PROVIDERS: Roselee Nova, MD is PCP   Werner Lean, MD is Cardiologist  LABS: {CHL AN LABS REVIEWED:112001::"Labs reviewed: Acceptable for surgery."} (all labs ordered are listed, but only abnormal results are displayed)  Labs Reviewed - No data to display   IMAGES:   EKG:   CV: Echo 05/16/2022 1. This is an inconclusive stress echocardiogram for ischemia.   2. This is an indeterminate risk study for ischemia (study done for  inducivle LVOT gradient).   3. At rest, hypderynamie LV with LVEF 70%. Septal thickness 26 mm.   4. There is resting systolic anterior motion of the mitral valve and  associated mitral regurgitation. There is notable contimation of the LVOT  gradient by mitral valve signal   5. Back calculation of gradient using mitral regurgitaiton method shows a  resting gradient of 29 mm Hg but an inducible gradient of 90 mm Hg.   6. 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   7. Trivial pericardial effusion without tamponade.   8. Left Atrial dilation.  Past Medical History:  Diagnosis Date   Aortic atherosclerosis ([facility])     Cancer ([facility])    Thyroid   Dyslipidemia    Dyspnea    HCM   Heart murmur    Hypertension    Hypertrophic cardiomyopathy ([place])    Osteoporosis    Palpitations    Sciatica    Scoliosis     Past Surgical History:  Procedure Laterality Date   ABDOMINAL HYSTERECTOMY  1997   total   HIP ARTHROPLASTY Right 2008   HIP ARTHROPLASTY Left 2013   WISDOM TOOTH EXTRACTION     age 72    MEDICATIONS: No current facility-administered medications for this encounter.    HYDROcodone-acetaminophen (NORCO/VICODIN) 5-325 MG tablet   ibuprofen (ADVIL) 200 MG tablet   metoprolol succinate (TOPROL-XL) 100 MG 24 hr tablet   metoprolol tartrate (LOPRESSOR) 25 MG tablet   Multiple Vitamins-Minerals (ADULT GUMMY PO)   NIFEdipine (PROCARDIA XL/NIFEDICAL XL) 60 MG 24 hr tablet   rosuvastatin (CRESTOR) 10 MG tablet   NIFEdipine (ADALAT CC) 60 MG 24 hr tablet    [facility], PA-C [facility] Pre-Surgical Testing [phone]

## 2022-10-12 NOTE — H&P (Signed)
REFERRING PHYSICIAN: Shamleffer, Ibtehal  PROVIDER: Chaye Misch Charlotta Newton, MD   Chief Complaint: New Consultation (Thyroid neoplasm of uncertain behavior, multiple thyroid nodules)  History of Present Illness:  Patient is referred by Dr. Vivia Ewing for surgical evaluation and management of multiple thyroid nodules and a newly diagnosed thyroid neoplasm of uncertain behavior. Patient had previously been evaluated for her thyroid while living in [place]. Recently she noted some asymmetry on self-examination. She demonstrated this to her primary care physician and was referred to endocrinology for evaluation. TSH level was normal at 2.04. Patient has never been on thyroid medication. She has had no prior head or neck surgery. Patient underwent an ultrasound exam on May 04, 2022. This demonstrated a normal-sized thyroid gland with heterogeneity. There were multiple bilateral nodules. 2 nodules on the right warranted biopsy. Both were found to have cytologic atypia, Bethesda category III. Both samples were sent for molecular genetic testing, AFIRMA, and 1 returned as suspicious, rendering a risk of malignancy of 50%. Patient is therefore referred to surgery for consideration for resection for definitive diagnosis and management. Patient relocated from [place] to [place] approximately 1 year ago.  Review of Systems: A complete review of systems was obtained from the patient. I have reviewed this information and discussed as appropriate with the patient. See HPI as well for other ROS.  Review of Systems  Constitutional: Negative.  HENT: Negative.  Eyes: Negative.  Respiratory: Negative.  Cardiovascular: Negative.  Gastrointestinal: Negative.  Genitourinary: Negative.  Musculoskeletal: Negative.  Skin: Negative.  Neurological: Negative.  Endo/Heme/Allergies: Negative.  Psychiatric/Behavioral: Negative.    Medical History: Past Medical History:  Diagnosis Date  Hypertension   Thyroid disease   Patient Active Problem List  Diagnosis  Multiple thyroid nodules  Neoplasm of uncertain behavior of thyroid gland   Past Surgical History:  Procedure Laterality Date  HYSTERECTOMY  left hip replacement  REMOVAL BLOOD CLOT FROM ANTERIOR SEGMENT  right hip replacement  tubal ligation    Allergies  Allergen Reactions  Codeine Other (See Comments)  DIZZINESS   Current Outpatient Medications on File Prior to Visit  Medication Sig Dispense Refill  HYDROcodone-acetaminophen (NORCO) 5-325 mg tablet Take 1 tablet by mouth 3 (three) times daily as needed  metoprolol succinate (TOPROL-XL) 100 MG XL tablet Take 100 mg by mouth once daily  metoprolol tartrate (LOPRESSOR) 25 MG tablet as directed  NIFEdipine (ADALAT CC) 60 MG ER tablet TAKE 1 TABLET (60 MG) BY ORAL ROUTE TWICE DAILY FOR 30 DAYS  rosuvastatin (CRESTOR) 10 MG tablet Take 10 mg by mouth at bedtime   No current facility-administered medications on file prior to visit.   Family History  Problem Relation Age of Onset  Stroke Mother  High blood pressure (Hypertension) Mother  Diabetes Mother    Social History   Tobacco Use  Smoking Status Former  Types: Cigarettes  Quit date: 10/27/2018  Years since quitting: 3.7  Smokeless Tobacco Not on file    Social History   Socioeconomic History  Marital status: Single  Tobacco Use  Smoking status: Former  Types: Cigarettes  Quit date: 10/27/2018  Years since quitting: 3.7  Substance and Sexual Activity  Alcohol use: Yes  Comment: occasionally  Drug use: Never   Objective:   Vitals:  BP: 128/76  Pulse: 86  Temp: 36.9 C (98.4 F)  SpO2: 98%  Weight: 73.9 kg (163 lb)  Height: 160 cm ('5\' 3"'$ )   Body mass index is 28.87 kg/m.  Physical  Exam   GENERAL APPEARANCE Comfortable, no acute issues Development: normal Gross deformities: none  SKIN Rash, lesions, ulcers: none Induration, erythema: none Nodules: none  palpable  EYES Conjunctiva and lids: normal Pupils: equal and reactive  EARS, NOSE, MOUTH, THROAT External ears: no lesion or deformity External nose: no lesion or deformity Hearing: grossly normal  NECK Symmetric: yes Trachea: midline Thyroid: no palpable nodules in the thyroid bed; left thyroid lobe is moderately firm to palpation without discrete or dominant mass. There is palpable nodularity in the right lobe, better appreciated with swallowing maneuver. There is no associated lymphadenopathy.  CHEST Respiratory effort: normal Retraction or accessory muscle use: no Breath sounds: normal bilaterally Rales, rhonchi, wheeze: none  CARDIOVASCULAR Auscultation: regular rhythm, normal rate Murmurs: Grade 3 systolic murmur upper left sternal border Pulses: radial pulse 2+ palpable Lower extremity edema: none  ABDOMEN Not assessed  GENITOURINARY/RECTAL Not assessed  MUSCULOSKELETAL Station and gait: normal Digits and nails: no clubbing or cyanosis Muscle strength: grossly normal all extremities Range of motion: grossly normal all extremities Deformity: none  LYMPHATIC Cervical: none palpable Supraclavicular: none palpable  PSYCHIATRIC Oriented to person, place, and time: yes Mood and affect: normal for situation Judgment and insight: appropriate for situation   Assessment and Plan:   Multiple thyroid nodules Neoplasm of uncertain behavior of thyroid gland  Patient is referred by her endocrinologist for surgical evaluation and management of thyroid neoplasm of uncertain behavior in the setting of multiple thyroid nodules.  Patient provided with a copy of "The Thyroid Book: Medical and Surgical Treatment of Thyroid Problems", published by [employer], 16 pages. Book reviewed and explained to patient during visit today.  Patient has bilateral thyroid nodules. 2 nodules on the right side underwent fine-needle aspiration biopsy and demonstrated atypia. One of the nodules  was found on AFIRMA testing to be suspicious, rendering a risk of malignancy of 50%. Today we discussed options for management. I have recommended total thyroidectomy given that the patient has bilateral nodules and a risk of malignancy. We discussed the procedure. We discussed the risk and benefits including the risk of recurrent laryngeal nerve injury and injury to parathyroid glands. We discussed the size of the surgical incision. We discussed the hospital stay to be anticipated. We discussed her postoperative recovery. We discussed the need for lifelong thyroid hormone replacement. We discussed the potential need for radioactive iodine treatment. The patient understands and wishes to proceed with surgery in the near future.   Armandina Gemma, MD [facility] A [place] practice Office: [phone]

## 2022-10-14 IMAGING — CR DG LUMBAR SPINE COMPLETE 4+V
7 series · 7 of 7 positions shown · non-contrast
Comparison: Left hip series the same day reported separately.

CLINICAL DATA: 70-year-old female with pain radiating from the
lumbar spine, thoracolumbar junction to the hips.

EXAM:
LUMBAR SPINE - COMPLETE 4+ VIEW

[w lumbar spine ap]
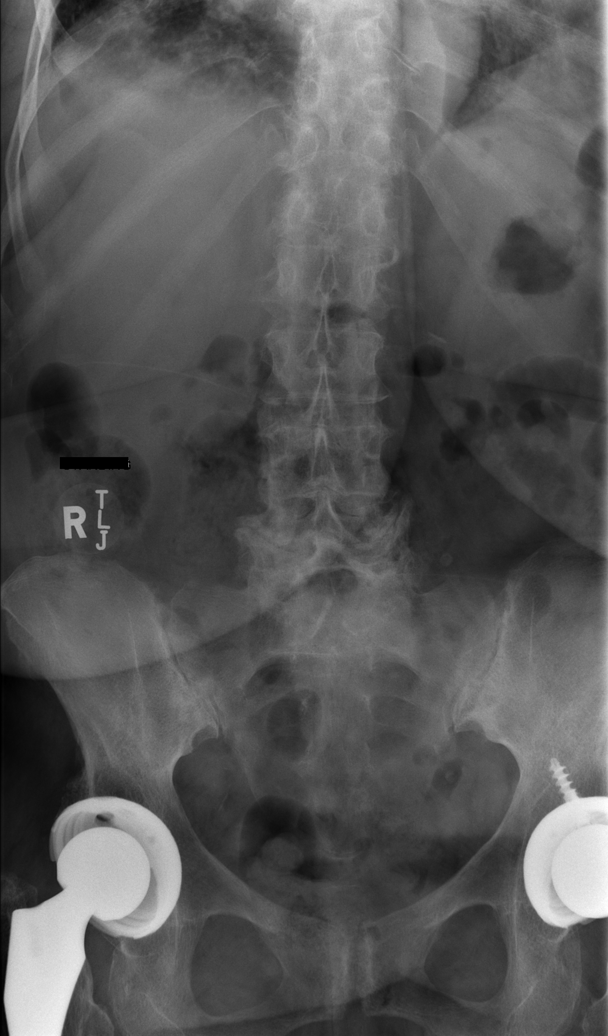

[w lumbar spine obl (1 of 4)]
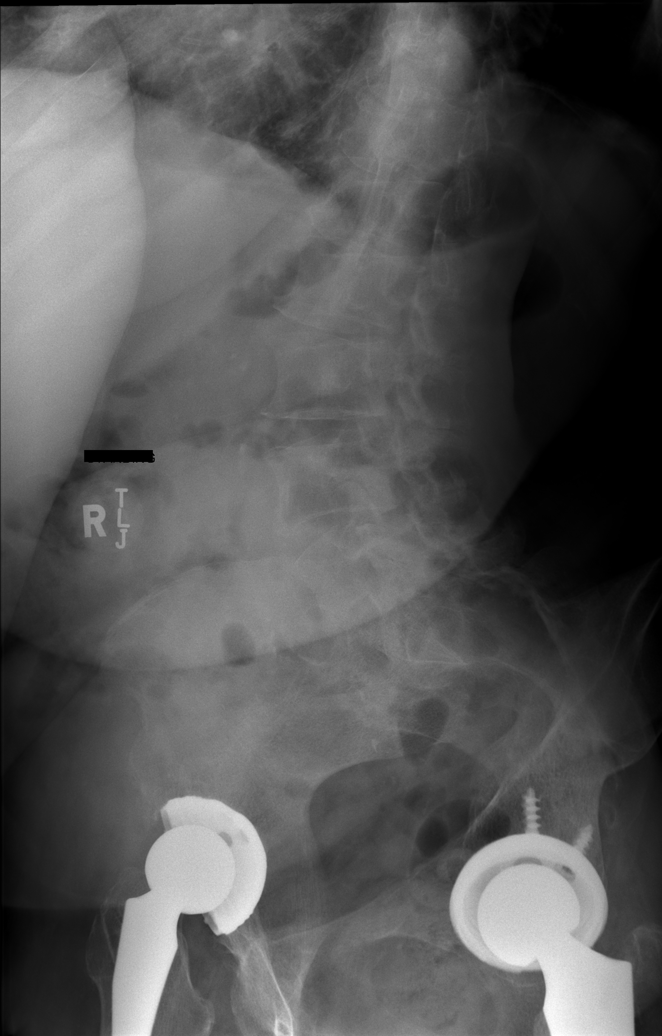

[w lumbar spine obl (2 of 4)]
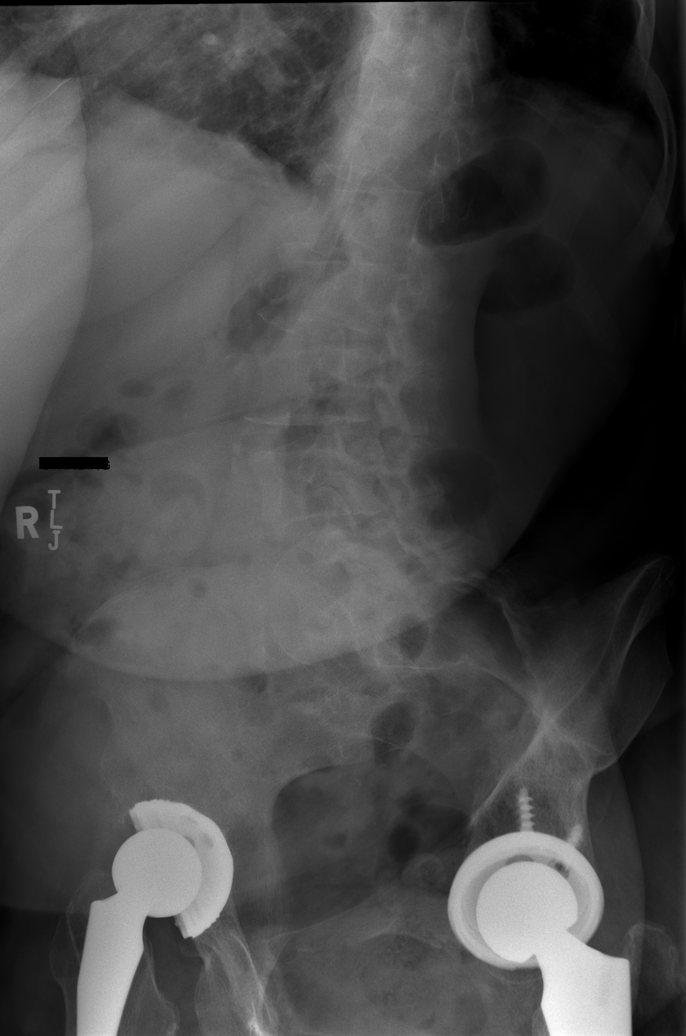

[w lumbar spine obl (3 of 4)]
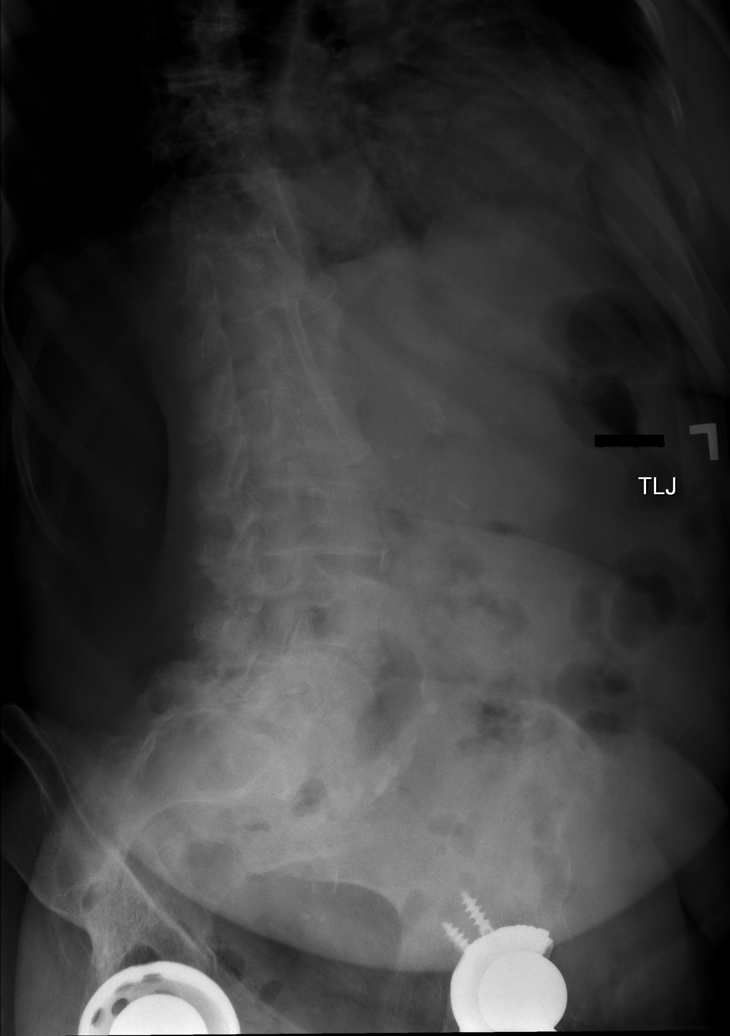

[w lumbar spine obl (4 of 4)]
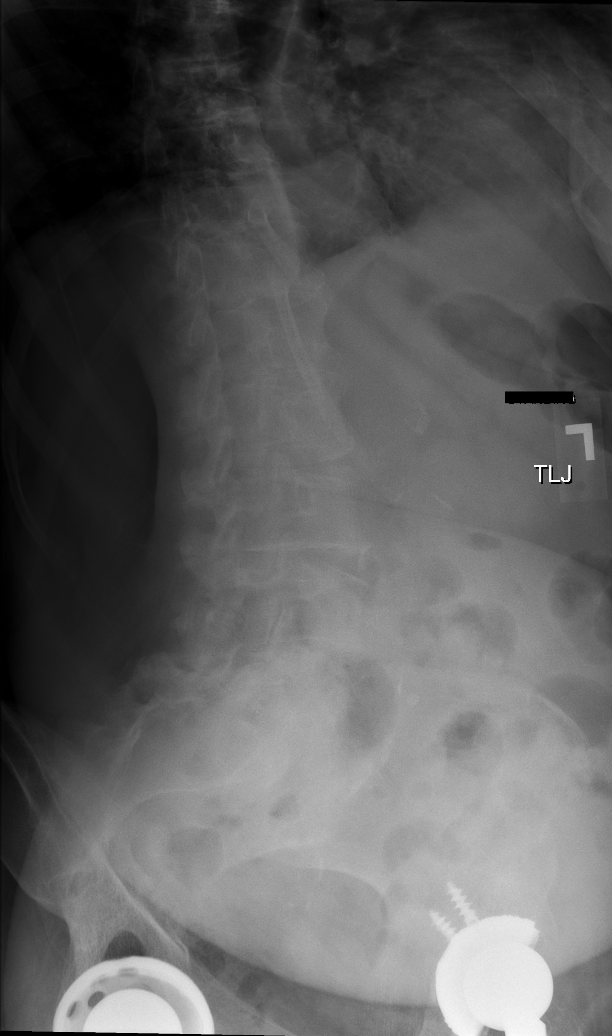

[w lumbar spine lat]
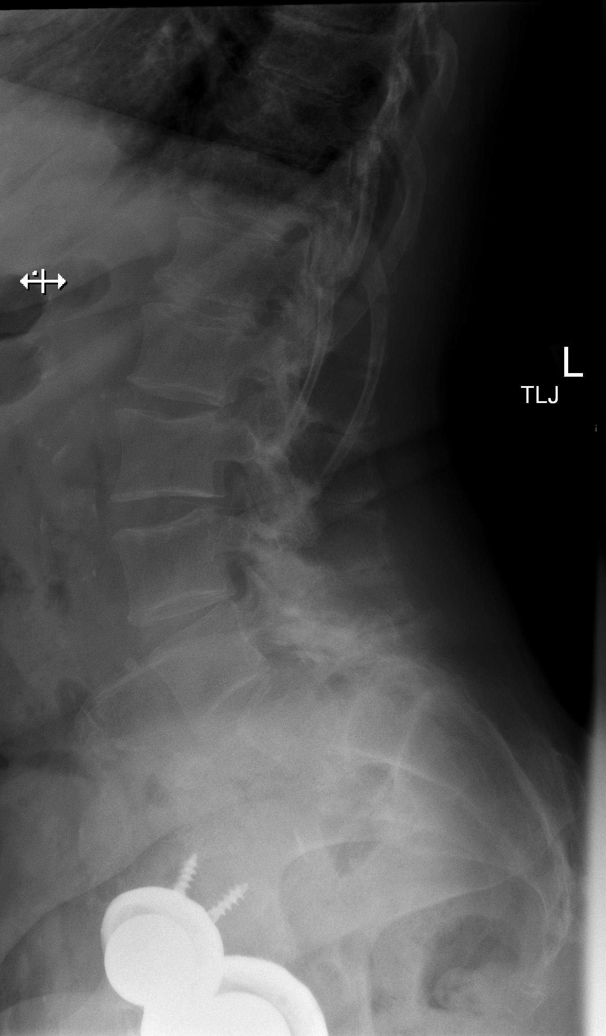

[w lumbar l-5 s-1 spot]
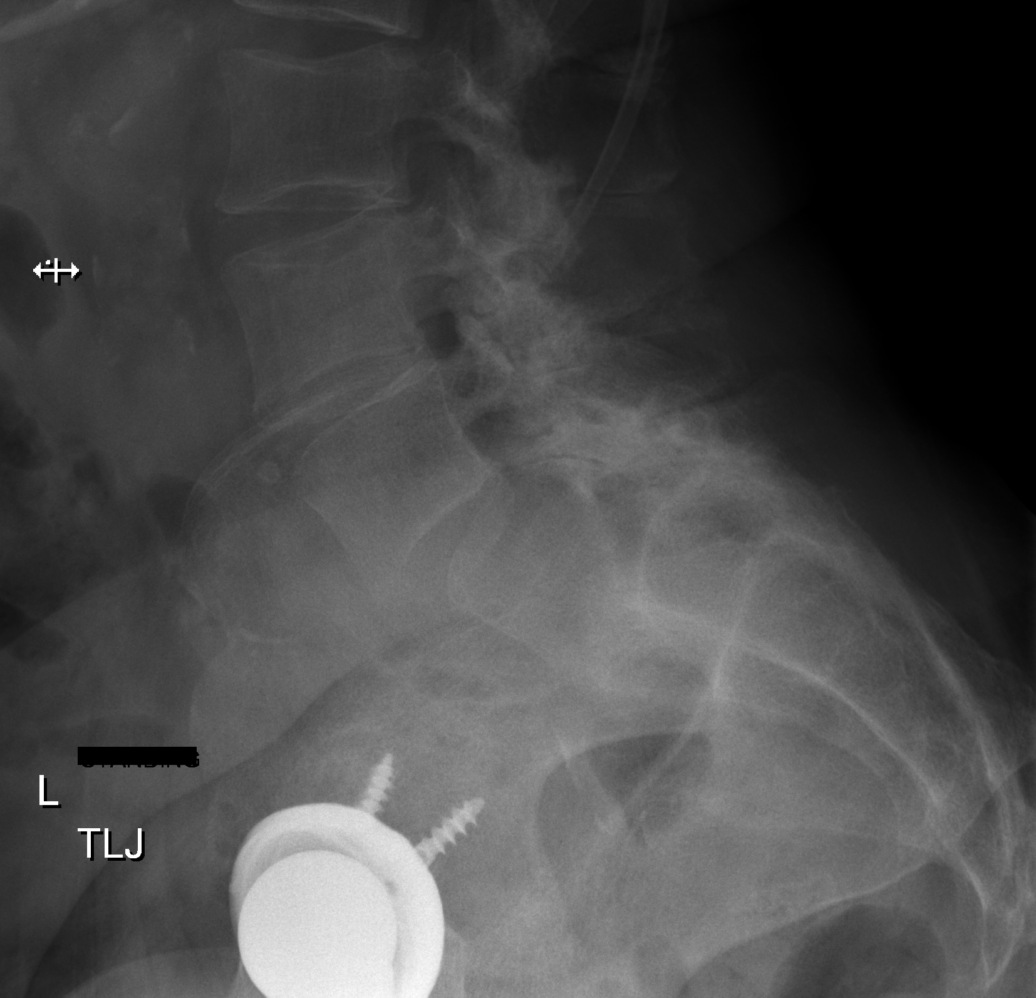

[7 of 7 positions shown; findings below may reference images not displayed]

FINDINGS: Transitional lumbosacral anatomy. Assuming full size ribs at T12,
the S1 level is lumbarized. Correlation with radiographs is
recommended prior to any operative intervention.

Grade 1-2 spondylolisthesis at both L5-S1 and S1-S2, 8-9 mm at each
level. Associated severe L5 and S1 facet hypertrophy, sclerosis. No
pars fracture is evident. Disc space loss at both levels.

Posterior disc space loss elsewhere in the lumbar spine with mild to
moderate facet hypertrophy at L3-L4 and L4-L5. Disc space loss with
moderate endplate spurring at L1-L2. Mild superior endplate
concavity at L3 and L4, favor chronic. Sacral ala and SI joints
appear intact. Bilateral hip arthroplasty. Negative visible bowel
gas pattern.

Calcified aortic atherosclerosis.
IMPRESSION: 1. Transitional lumbosacral anatomy with a lumbarized S1 level
assuming full size ribs at T12. Correlation with radiographs is
recommended prior to any operative intervention.
2. Mild superior endplate compression at L3 and L4. Favor chronic,
but noncontrast Lumbar MRI or Nuclear Medicine Whole-body Bone Scan
would best determine acuity.
3. Grade 1 to 2 spondylolisthesis at both L5-S1 and S1-S2 with
associated severe facet arthropathy.
4. Widespread posterior lumbar disc space loss. Moderate endplate
degeneration at L1-L2. Moderate facet degeneration at L3-L4.
5.  Aortic Atherosclerosis ([id number]-XCL.[id number]).

## 2022-10-14 NOTE — Anesthesia Preprocedure Evaluation (Signed)
Anesthesia Evaluation  Patient identified by MRN, date of birth, ID band Patient awake    Reviewed: Allergy & Precautions, NPO status , Patient's Chart, lab work & pertinent test results  Airway Mallampati: II  TM Distance: >3 FB Neck ROM: Full    Dental  (+) Missing, Poor Dentition   Pulmonary former smoker   breath sounds clear to auscultation       Cardiovascular hypertension,  Rhythm:Regular Rate:Normal  Echo:  3. At rest, hypderynamie LV with LVEF 70%. Septal thickness 26 mm.   4. There is resting systolic anterior motion of the mitral valve and  associated mitral regurgitation. There is notable contimation of the LVOT  gradient by mitral valve signal   5. Back calculation of gradient using mitral regurgitaiton method shows a  resting gradient of 29 mm Hg but an inducible gradient of 90 mm Hg.   6. 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   7. Trivial pericardial effusion without tamponade.   8. Left Atrial dilation.   - HOCM   Neuro/Psych  Neuromuscular disease    GI/Hepatic negative GI ROS, Neg liver ROS,,,  Endo/Other  negative endocrine ROS    Renal/GU negative Renal ROS     Musculoskeletal negative musculoskeletal ROS (+)    Abdominal   Peds  Hematology negative hematology ROS (+)   Anesthesia Other Findings   Reproductive/Obstetrics                             Anesthesia Physical Anesthesia Plan  ASA: 3  Anesthesia Plan: General   Post-op Pain Management: Tylenol PO (pre-op)*   Induction: Intravenous  PONV Risk Score and Plan: 4 or greater and Ondansetron, Dexamethasone, Midazolam and Scopolamine patch - Pre-op  Airway Management Planned: Oral ETT  Additional Equipment: None  Intra-op Plan:   Post-operative Plan: Extubation in OR  Informed Consent: I have reviewed the patients History and Physical, chart, labs and discussed the procedure including  the risks, benefits and alternatives for the proposed anesthesia with the patient or authorized representative who has indicated his/her understanding and acceptance.     Dental advisory given  Plan Discussed with: CRNA  Anesthesia Plan Comments: (See PAT note 10/12/2022)       Anesthesia Quick Evaluation

## 2022-10-15 ENCOUNTER — Other Ambulatory Visit: Payer: Self-pay

## 2022-10-15 ENCOUNTER — Encounter (HOSPITAL_COMMUNITY)
Admission: RE | Disposition: A | Discharge status: Home / Self Care | Payer: Self-pay | Source: Home / Self Care | Attending: Surgery

## 2022-10-15 ENCOUNTER — Ambulatory Visit (HOSPITAL_BASED_OUTPATIENT_CLINIC_OR_DEPARTMENT_OTHER): Payer: Medicare HMO | Admitting: Physician Assistant

## 2022-10-15 ENCOUNTER — Ambulatory Visit (HOSPITAL_COMMUNITY)
Admission: RE | Admit: 2022-10-15 | Discharge: 2022-10-16 | Disposition: A | Discharge status: Home / Self Care | Payer: Medicare HMO | Attending: Surgery | Admitting: Surgery

## 2022-10-15 ENCOUNTER — Ambulatory Visit (HOSPITAL_COMMUNITY): Payer: Medicare HMO | Admitting: Physician Assistant

## 2022-10-15 ENCOUNTER — Encounter (HOSPITAL_COMMUNITY): Payer: Self-pay | Admitting: Surgery

## 2022-10-15 DIAGNOSIS — E063 Autoimmune thyroiditis: Secondary | ICD-10-CM | POA: Diagnosis not present

## 2022-10-15 DIAGNOSIS — I422 Other hypertrophic cardiomyopathy: Secondary | ICD-10-CM | POA: Insufficient documentation

## 2022-10-15 DIAGNOSIS — I10 Essential (primary) hypertension: Secondary | ICD-10-CM | POA: Insufficient documentation

## 2022-10-15 DIAGNOSIS — E042 Nontoxic multinodular goiter: Secondary | ICD-10-CM | POA: Diagnosis not present

## 2022-10-15 DIAGNOSIS — Z87891 Personal history of nicotine dependence: Secondary | ICD-10-CM | POA: Diagnosis not present

## 2022-10-15 DIAGNOSIS — D440 Neoplasm of uncertain behavior of thyroid gland: Secondary | ICD-10-CM

## 2022-10-15 HISTORY — PX: THYROIDECTOMY: SHX17

## 2022-10-15 SURGERY — THYROIDECTOMY
Anesthesia: General | Site: Neck

## 2022-10-15 MED ORDER — ORAL CARE MOUTH RINSE: 15.0000 mL | Freq: Once | OROMUCOSAL | Status: AC

## 2022-10-15 MED ORDER — FENTANYL CITRATE PF 50 MCG/ML IJ SOSY
PREFILLED_SYRINGE | INTRAMUSCULAR | Status: AC
  Filled 2022-10-15: qty 2

## 2022-10-15 MED ORDER — CHLORHEXIDINE GLUCONATE CLOTH 2 % EX PADS: 6.0000 | MEDICATED_PAD | Freq: Once | CUTANEOUS | Status: DC

## 2022-10-15 MED ORDER — HEMOSTATIC AGENTS (NO CHARGE) OPTIME
TOPICAL | Status: DC | PRN
  Administered 2022-10-15: 1 via TOPICAL

## 2022-10-15 MED ORDER — HYDROMORPHONE HCL 1 MG/ML IJ SOLN: 1.0000 mg | INTRAMUSCULAR | Status: DC | PRN

## 2022-10-15 MED ORDER — FENTANYL CITRATE PF 50 MCG/ML IJ SOSY
25.0000 ug | PREFILLED_SYRINGE | INTRAMUSCULAR | Status: DC | PRN
  Administered 2022-10-15 (×2): 50 ug via INTRAVENOUS

## 2022-10-15 MED ORDER — ACETAMINOPHEN 325 MG PO TABS: 325.0000 mg | ORAL_TABLET | ORAL | Status: DC | PRN

## 2022-10-15 MED ORDER — ROCURONIUM BROMIDE 100 MG/10ML IV SOLN
INTRAVENOUS | Status: DC | PRN
  Administered 2022-10-15: 70 mg via INTRAVENOUS

## 2022-10-15 MED ORDER — ACETAMINOPHEN 500 MG PO TABS: 1000.0000 mg | ORAL_TABLET | Freq: Once | ORAL | Status: DC

## 2022-10-15 MED ORDER — AMISULPRIDE (ANTIEMETIC) 5 MG/2ML IV SOLN: 10.0000 mg | Freq: Once | INTRAVENOUS | Status: DC | PRN

## 2022-10-15 MED ORDER — CEFAZOLIN SODIUM-DEXTROSE 2-4 GM/100ML-% IV SOLN
INTRAVENOUS | Status: AC
  Filled 2022-10-15: qty 100

## 2022-10-15 MED ORDER — ACETAMINOPHEN 10 MG/ML IV SOLN: 1000.0000 mg | Freq: Once | INTRAVENOUS | Status: DC | PRN

## 2022-10-15 MED ORDER — ONDANSETRON 4 MG PO TBDP: 4.0000 mg | ORAL_TABLET | Freq: Four times a day (QID) | ORAL | Status: DC | PRN

## 2022-10-15 MED ORDER — PROMETHAZINE HCL 25 MG/ML IJ SOLN: 6.2500 mg | INTRAMUSCULAR | Status: DC | PRN

## 2022-10-15 MED ORDER — TRAMADOL HCL 50 MG PO TABS: 50.0000 mg | ORAL_TABLET | Freq: Four times a day (QID) | ORAL | Status: DC | PRN

## 2022-10-15 MED ORDER — LACTATED RINGERS IV SOLN: INTRAVENOUS | Status: DC

## 2022-10-15 MED ORDER — PROPOFOL 10 MG/ML IV BOLUS
INTRAVENOUS | Status: DC | PRN
  Administered 2022-10-15: 130 mg via INTRAVENOUS

## 2022-10-15 MED ORDER — METOPROLOL TARTRATE 25 MG PO TABS: 25.0000 mg | ORAL_TABLET | Freq: Two times a day (BID) | ORAL | Status: DC | PRN

## 2022-10-15 MED ORDER — CALCIUM CARBONATE 1250 (500 CA) MG PO TABS
2.0000 | ORAL_TABLET | Freq: Three times a day (TID) | ORAL | Status: DC
  Administered 2022-10-15 – 2022-10-16 (×4): 2500 mg via ORAL
  Filled 2022-10-15 (×4): qty 2

## 2022-10-15 MED ORDER — LIDOCAINE HCL (PF) 2 % IJ SOLN
INTRAMUSCULAR | Status: AC
  Filled 2022-10-15: qty 5

## 2022-10-15 MED ORDER — ACETAMINOPHEN 325 MG PO TABS: 650.0000 mg | ORAL_TABLET | Freq: Four times a day (QID) | ORAL | Status: DC | PRN

## 2022-10-15 MED ORDER — SUGAMMADEX SODIUM 200 MG/2ML IV SOLN
INTRAVENOUS | Status: DC | PRN
  Administered 2022-10-15: 200 mg via INTRAVENOUS

## 2022-10-15 MED ORDER — CHLORHEXIDINE GLUCONATE 0.12 % MT SOLN
15.0000 mL | Freq: Once | OROMUCOSAL | Status: AC
  Administered 2022-10-15: 15 mL via OROMUCOSAL

## 2022-10-15 MED ORDER — LIDOCAINE HCL (CARDIAC) PF 100 MG/5ML IV SOSY
PREFILLED_SYRINGE | INTRAVENOUS | Status: DC | PRN
  Administered 2022-10-15: 60 mg via INTRAVENOUS

## 2022-10-15 MED ORDER — SCOPOLAMINE 1 MG/3DAYS TD PT72: 1.0000 | MEDICATED_PATCH | TRANSDERMAL | Status: DC

## 2022-10-15 MED ORDER — METOPROLOL SUCCINATE ER 50 MG PO TB24
100.0000 mg | ORAL_TABLET | Freq: Every day | ORAL | Status: DC
  Administered 2022-10-16: 100 mg via ORAL
  Filled 2022-10-15: qty 2

## 2022-10-15 MED ORDER — ONDANSETRON HCL 4 MG/2ML IJ SOLN
INTRAMUSCULAR | Status: DC | PRN
  Administered 2022-10-15: 4 mg via INTRAVENOUS

## 2022-10-15 MED ORDER — ROCURONIUM BROMIDE 10 MG/ML (PF) SYRINGE
PREFILLED_SYRINGE | INTRAVENOUS | Status: AC
  Filled 2022-10-15: qty 10

## 2022-10-15 MED ORDER — OXYCODONE HCL 5 MG PO TABS: 5.0000 mg | ORAL_TABLET | Freq: Once | ORAL | Status: DC | PRN

## 2022-10-15 MED ORDER — OXYCODONE HCL 5 MG/5ML PO SOLN: 5.0000 mg | Freq: Once | ORAL | Status: DC | PRN

## 2022-10-15 MED ORDER — FENTANYL CITRATE (PF) 100 MCG/2ML IJ SOLN
INTRAMUSCULAR | Status: AC
  Filled 2022-10-15: qty 2

## 2022-10-15 MED ORDER — ACETAMINOPHEN 160 MG/5ML PO SOLN: 325.0000 mg | ORAL | Status: DC | PRN

## 2022-10-15 MED ORDER — CEFAZOLIN SODIUM-DEXTROSE 2-4 GM/100ML-% IV SOLN: 2.0000 g | INTRAVENOUS | Status: DC

## 2022-10-15 MED ORDER — ACETAMINOPHEN 650 MG RE SUPP: 650.0000 mg | Freq: Four times a day (QID) | RECTAL | Status: DC | PRN

## 2022-10-15 MED ORDER — PROPOFOL 10 MG/ML IV BOLUS
INTRAVENOUS | Status: AC
  Filled 2022-10-15: qty 20

## 2022-10-15 MED ORDER — FENTANYL CITRATE (PF) 100 MCG/2ML IJ SOLN
INTRAMUSCULAR | Status: DC | PRN
  Administered 2022-10-15 (×2): 50 ug via INTRAVENOUS

## 2022-10-15 MED ORDER — ONDANSETRON HCL 4 MG/2ML IJ SOLN: 4.0000 mg | Freq: Four times a day (QID) | INTRAMUSCULAR | Status: DC | PRN

## 2022-10-15 MED ORDER — DEXAMETHASONE SODIUM PHOSPHATE 10 MG/ML IJ SOLN
INTRAMUSCULAR | Status: AC
  Filled 2022-10-15: qty 1

## 2022-10-15 MED ORDER — PHENYLEPHRINE 80 MCG/ML (10ML) SYRINGE FOR IV PUSH (FOR BLOOD PRESSURE SUPPORT)
PREFILLED_SYRINGE | INTRAVENOUS | Status: DC | PRN
  Administered 2022-10-15: 160 ug via INTRAVENOUS

## 2022-10-15 MED ORDER — SODIUM CHLORIDE 0.45 % IV SOLN: INTRAVENOUS | Status: DC

## 2022-10-15 MED ORDER — 0.9 % SODIUM CHLORIDE (POUR BTL) OPTIME
TOPICAL | Status: DC | PRN
  Administered 2022-10-15: 1000 mL

## 2022-10-15 MED ORDER — LACTATED RINGERS IV SOLN: INTRAVENOUS | Status: DC | PRN

## 2022-10-15 MED ORDER — OXYCODONE HCL 5 MG PO TABS
5.0000 mg | ORAL_TABLET | ORAL | Status: DC | PRN
  Administered 2022-10-15: 10 mg via ORAL
  Administered 2022-10-15: 5 mg via ORAL
  Administered 2022-10-15 – 2022-10-16 (×2): 10 mg via ORAL
  Filled 2022-10-15: qty 1
  Filled 2022-10-15 (×3): qty 2

## 2022-10-15 MED ORDER — DEXAMETHASONE SODIUM PHOSPHATE 10 MG/ML IJ SOLN
INTRAMUSCULAR | Status: DC | PRN
  Administered 2022-10-15: 10 mg via INTRAVENOUS

## 2022-10-15 MED ORDER — NIFEDIPINE ER OSMOTIC RELEASE 60 MG PO TB24
60.0000 mg | ORAL_TABLET | Freq: Two times a day (BID) | ORAL | Status: DC
  Administered 2022-10-15 – 2022-10-16 (×2): 60 mg via ORAL
  Filled 2022-10-15 (×2): qty 1

## 2022-10-15 MED ORDER — ONDANSETRON HCL 4 MG/2ML IJ SOLN
INTRAMUSCULAR | Status: AC
  Filled 2022-10-15: qty 2

## 2022-10-15 SURGICAL SUPPLY — 29 items
ATTRACTOMAT 16X20 MAGNETIC DRP (DRAPES) ×1 IMPLANT
BAG COUNTER SPONGE SURGICOUNT (BAG) ×1 IMPLANT
BLADE SURG 15 STRL LF DISP TIS (BLADE) ×1 IMPLANT
BLADE SURG 15 STRL SS (BLADE) ×1
CHLORAPREP W/TINT 26 (MISCELLANEOUS) ×1 IMPLANT
CLIP TI MEDIUM 6 (CLIP) ×2 IMPLANT
CLIP TI WIDE RED SMALL 6 (CLIP) ×2 IMPLANT
COVER SURGICAL LIGHT HANDLE (MISCELLANEOUS) ×1 IMPLANT
DERMABOND ADVANCED .7 DNX12 (GAUZE/BANDAGES/DRESSINGS) ×1 IMPLANT
DRAPE LAPAROTOMY T 98X78 PEDS (DRAPES) ×1 IMPLANT
DRAPE UTILITY XL STRL (DRAPES) ×1 IMPLANT
ELECT PENCIL ROCKER SW 15FT (MISCELLANEOUS) ×1 IMPLANT
ELECT REM PT RETURN 15FT ADLT (MISCELLANEOUS) ×1 IMPLANT
GAUZE 4X4 16PLY ~~LOC~~+RFID DBL (SPONGE) ×1 IMPLANT
GLOVE SURG ORTHO 8.0 STRL STRW (GLOVE) ×1 IMPLANT
GOWN STRL REUS W/ TWL XL LVL3 (GOWN DISPOSABLE) ×2 IMPLANT
GOWN STRL REUS W/TWL XL LVL3 (GOWN DISPOSABLE) ×2
HEMOSTAT SURGICEL 2X4 FIBR (HEMOSTASIS) ×1 IMPLANT
ILLUMINATOR WAVEGUIDE N/F (MISCELLANEOUS) ×1 IMPLANT
KIT BASIN OR (CUSTOM PROCEDURE TRAY) ×1 IMPLANT
KIT TURNOVER KIT A (KITS) IMPLANT
PACK BASIC VI WITH GOWN DISP (CUSTOM PROCEDURE TRAY) ×1 IMPLANT
SHEARS HARMONIC 9CM CVD (BLADE) ×1 IMPLANT
SUT MNCRL AB 4-0 PS2 18 (SUTURE) ×1 IMPLANT
SUT VIC AB 3-0 SH 18 (SUTURE) ×2 IMPLANT
SYR BULB IRRIG 60ML STRL (SYRINGE) ×1 IMPLANT
TOWEL OR 17X26 10 PK STRL BLUE (TOWEL DISPOSABLE) ×1 IMPLANT
TOWEL OR NON WOVEN STRL DISP B (DISPOSABLE) ×1 IMPLANT
TUBING CONNECTING 10 (TUBING) ×1 IMPLANT

## 2022-10-15 NOTE — Op Note (Signed)
Procedure Note  Pre-operative Diagnosis:  thyroid neoplasm of uncertain behavior, multiple thyroid nodules  Post-operative Diagnosis:  same  Surgeon:  Armandina Gemma, MD  Assistant:  Malachi Pro, PA-C   Procedure:  Total thyroidectomy  Anesthesia:  General  Estimated Blood Loss:  minimal  Drains: none         Specimen: thyroid to pathology  Indications:  Patient is referred by Dr. Vivia Ewing for surgical evaluation and management of multiple thyroid nodules and a newly diagnosed thyroid neoplasm of uncertain behavior. Patient had previously been evaluated for her thyroid while living in [place]. Recently she noted some asymmetry on self-examination. She demonstrated this to her primary care physician and was referred to endocrinology for evaluation. TSH level was normal at 2.04. Patient has never been on thyroid medication. She has had no prior head or neck surgery. Patient underwent an ultrasound exam on May 04, 2022. This demonstrated a normal-sized thyroid gland with heterogeneity. There were multiple bilateral nodules. 2 nodules on the right warranted biopsy. Both were found to have cytologic atypia, Bethesda category III. Both samples were sent for molecular genetic testing, AFIRMA, and 1 returned as suspicious, rendering a risk of malignancy of 50%. Patient is therefore referred to surgery for consideration for resection for definitive diagnosis and management. Patient relocated from [place] to [place] approximately 1 year ago.   Procedure Details: Procedure was done in OR #1 at the [facility]. The patient was brought to the operating room and placed in a supine position on the operating room table. Following administration of general anesthesia, the patient was positioned and then prepped and draped in the usual aseptic fashion. After ascertaining that an adequate level of anesthesia had been achieved, a small Kocher incision was made with #15 blade. Dissection was  carried through subcutaneous tissues and platysma.Hemostasis was achieved with the electrocautery. Skin flaps were elevated cephalad and caudad from the thyroid notch to the sternal notch. A Mahorner self-retaining retractor was placed for exposure. Strap muscles were incised in the midline and dissection was begun on the left side.  Strap muscles were reflected laterally.  Left thyroid lobe was moderately enlarged with multiple small firm nodules.  The left lobe was gently mobilized with blunt dissection. Superior pole vessels were dissected out and divided individually between small and medium ligaclips with the harmonic scalpel. The thyroid lobe was rolled anteriorly. Branches of the inferior thyroid artery were divided between small ligaclips with the harmonic scalpel. Inferior venous tributaries were divided between ligaclips. Both the superior and inferior parathyroid glands were identified and preserved on their vascular pedicles. The recurrent laryngeal nerve was identified and preserved along its course. The ligament of Gwenlyn Found was released with the electrocautery and the gland was mobilized onto the anterior trachea. Isthmus was mobilized across the midline. There was a moderate sized pyramidal lobe present which was dissected out and resected with the isthmus. Dry pack was placed in the left neck.  The right thyroid lobe was gently mobilized with blunt dissection. Right thyroid lobe was markedly enlarged and irregular in shape.  It extended inferiorly and posteriorly.  It was multilobulated and quite firm worrisome for malignancy. Superior pole vessels were dissected out and divided between small and medium ligaclips with the Harmonic scalpel. Superior parathyroid was identified and preserved. Inferior venous tributaries were divided between medium ligaclips with the harmonic scalpel. The right thyroid lobe was rolled anteriorly and the branches of the inferior thyroid artery divided between small  ligaclips. The  right recurrent laryngeal nerve was identified and preserved along its course. The ligament of Gwenlyn Found was released with the electrocautery. The right thyroid lobe was mobilized onto the anterior trachea and the remainder of the thyroid was dissected off the anterior trachea and the thyroid was completely excised. A suture was used to mark the left lobe. The entire thyroid gland was submitted to pathology for review.  The neck was irrigated with warm saline. Fibrillar was placed throughout the operative field. Strap muscles were approximated in the midline with interrupted 3-0 Vicryl sutures. Platysma was closed with interrupted 3-0 Vicryl sutures. Skin was closed with a running 4-0 Monocryl subcuticular suture. Wound was washed and Dermabond was applied. The patient was awakened from anesthesia and brought to the recovery room. The patient tolerated the procedure well.   Armandina Gemma, [facility] Office: [phone]

## 2022-10-15 NOTE — Anesthesia Postprocedure Evaluation (Signed)
Anesthesia Post Note  Patient: Grace Dyer  Procedure(s) Performed: TOTAL THYROIDECTOMY (Neck)     Patient location during evaluation: PACU Anesthesia Type: General Level of consciousness: awake and alert Pain management: pain level controlled Vital Signs Assessment: post-procedure vital signs reviewed and stable Respiratory status: spontaneous breathing, nonlabored ventilation, respiratory function stable and patient connected to nasal cannula oxygen Cardiovascular status: blood pressure returned to baseline and stable Postop Assessment: no apparent nausea or vomiting Anesthetic complications: no  No notable events documented.  Last Vitals:  Vitals:   10/15/22 1130 10/15/22 1237  BP: (!) 147/67 122/67  Pulse: 60 74  Resp: 16 17  Temp: 36.8 C 37.2 C  SpO2: 100% 100%    Last Pain:  Vitals:   10/15/22 1237  TempSrc: Oral  PainSc:                  Effie Berkshire

## 2022-10-15 NOTE — Interval H&P Note (Signed)
History and Physical Interval Note:  10/15/2022 7:02 AM  Grace Dyer  has presented today for surgery, with the diagnosis of THYROID NEOPLASM OF UNCERTAIN BEHAVIOR, MULTIPLE THYROID NODULES.  The various methods of treatment have been discussed with the patient and family. After consideration of risks, benefits and other options for treatment, the patient has consented to    Procedure(s): TOTAL THYROIDECTOMY (N/A) as a surgical intervention.    The patient's history has been reviewed, patient examined, no change in status, stable for surgery.  I have reviewed the patient's chart and labs.  Questions were answered to the patient's satisfaction.    Armandina Gemma, [facility] A [place] practice Office: [place]

## 2022-10-15 NOTE — Transfer of Care (Signed)
Immediate Anesthesia Transfer of Care Note  Patient: Grace Dyer  Procedure(s) Performed: TOTAL THYROIDECTOMY (Neck)  Patient Location: PACU  Anesthesia Type:General  Level of Consciousness: awake, alert , and oriented  Airway & Oxygen Therapy: Patient Spontanous Breathing and Patient connected to face mask oxygen  Post-op Assessment: Report given to RN and Post -op Vital signs reviewed and stable  Post vital signs: Reviewed and stable  Last Vitals:  Vitals Value Taken Time  BP 164/85 10/15/22 0930  Temp    Pulse 74 10/15/22 0930  Resp 25 10/15/22 0930  SpO2 100 % 10/15/22 0930  Vitals shown include unvalidated device data.  Last Pain:  Vitals:   10/15/22 0551  TempSrc:   PainSc: 0-No pain         Complications: No notable events documented.

## 2022-10-15 NOTE — Anesthesia Procedure Notes (Signed)
Procedure Name: Intubation Date/Time: 10/15/2022 9:22 AM  Performed by: [place] Manus Rudd, CRNAPre-anesthesia Checklist: Patient identified, Emergency Drugs available, Suction available and Patient being monitored Patient Re-evaluated:Patient Re-evaluated prior to induction Oxygen Delivery Method: Circle system utilized Preoxygenation: Pre-oxygenation with 100% oxygen Induction Type: IV induction Ventilation: Mask ventilation without difficulty Laryngoscope Size: Mac and 3 Grade View: Grade I Tube type: Oral Tube size: 7.0 mm Number of attempts: 1 Airway Equipment and Method: Stylet and Oral airway Placement Confirmation: ETT inserted through vocal cords under direct vision, positive ETCO2 and breath sounds checked- equal and bilateral Tube secured with: Tape Dental Injury: Teeth and Oropharynx as per pre-operative assessment

## 2022-10-16 ENCOUNTER — Encounter: Payer: Medicare HMO | Admitting: Genetic Counselor

## 2022-10-16 ENCOUNTER — Encounter (HOSPITAL_COMMUNITY): Payer: Self-pay | Admitting: Surgery

## 2022-10-16 DIAGNOSIS — E042 Nontoxic multinodular goiter: Secondary | ICD-10-CM | POA: Diagnosis not present

## 2022-10-16 LAB — BASIC METABOLIC PANEL
Anion gap: 6 (ref 5–15)
BUN: 14 mg/dL (ref 8–23)
CO2: 28 mmol/L (ref 22–32)
Calcium: 9.9 mg/dL (ref 8.9–10.3)
Chloride: 103 mmol/L (ref 98–111)
Creatinine, Ser: 1.04 mg/dL — ABNORMAL HIGH (ref 0.44–1.00)
GFR, Estimated: 57 mL/min — ABNORMAL LOW (ref >60)
Glucose, Bld: 121 mg/dL — ABNORMAL HIGH (ref 70–99)
Potassium: 4.1 mmol/L (ref 3.5–5.1)
Sodium: 137 mmol/L (ref 135–145)

## 2022-10-16 MED ORDER — OXYCODONE HCL 5 MG PO TABS: 5.0000 mg | ORAL_TABLET | Freq: Four times a day (QID) | ORAL | 0 refills | Status: DC | PRN

## 2022-10-16 MED ORDER — LEVOTHYROXINE SODIUM 100 MCG PO TABS: 100.0000 ug | ORAL_TABLET | Freq: Every day | ORAL | 2 refills | Status: DC

## 2022-10-16 MED ORDER — CALCIUM CARBONATE ANTACID 500 MG PO CHEW: 2.0000 | CHEWABLE_TABLET | Freq: Two times a day (BID) | ORAL | 1 refills | Status: DC

## 2022-10-16 NOTE — Discharge Instructions (Signed)
[facility] - Dr. Cecilio Ohlrich  THYROID & PARATHYROID SURGERY:  POST-OP INSTRUCTIONS  Always review the instruction sheet provided by the hospital nurse at discharge.  A prescription for pain medication may be sent to your pharmacy at the time of discharge.  Take your pain medication as prescribed.  If narcotic pain medicine is not needed, then you may take acetaminophen (Tylenol) or ibuprofen (Advil) as needed for pain or soreness.  Take your normal home medications as prescribed unless otherwise directed.  If you need a refill on your pain medication, please contact the office during regular business hours.  Prescriptions will not be processed by the office after 5:00PM or on weekends.  Start with a light diet upon arrival home, such as soup and crackers or toast.  Be sure to drink plenty of fluids.  Resume your normal diet the day after surgery.  Most patients will experience some swelling and bruising on the chest and neck area.  Ice packs will help for the first 48 hours after arriving home.  Swelling and bruising will take several days to resolve.   It is common to experience some constipation after surgery.  Increasing fluid intake and taking a stool softener (Colace) will usually help to prevent this problem.  A mild laxative (Milk of Magnesia or Miralax) should be taken according to package directions if there has been no bowel movement after 48 hours.  Dermabond glue covers your incision. This seals the wound and you may shower at any time. The Dermabond will remain in place for about a week.  You may gradually remove the glue when it loosens around the edges.  If you need to loosen the Dermabond for removal, apply a layer of Vaseline to the wound for 15 minutes and then remove with a Kleenex. Your sutures are under the skin and will not show - they will dissolve on their own.  You may resume light daily activities beginning the day after discharge (such as self-care,  walking, climbing stairs), gradually increasing activities as tolerated. You may have sexual intercourse when it is comfortable. Refrain from any heavy lifting or straining until approved by your doctor. You may drive when you no longer are taking prescription pain medication, you can comfortably wear a seatbelt, and you can safely maneuver your car and apply the brakes.  You will see your doctor in the office for a follow-up appointment approximately three weeks after your surgery.  Make sure that you call for this appointment within a day or two after you arrive home to insure a convenient appointment time. Please have any requested laboratory tests performed a few days prior to your office visit so that the results will be available at your follow up appointment.  WHEN TO CALL THE [facility] OFFICE: -- Fever greater than 101.5 -- Inability to urinate -- Nausea and/or vomiting - persistent -- Extreme swelling or bruising -- Continued bleeding from incision -- Increased pain, redness, or drainage from the incision -- Difficulty swallowing or breathing -- Muscle cramping or spasms -- Numbness or tingling in hands or around lips  The clinic staff is available to answer your questions during regular business hours.  Please don't hesitate to call and ask to speak to one of the nurses if you have concerns.  [facility] OFFICE: [phone] (24 hours)  Please sign up for MyChart accounts. This will allow you to communicate directly with my nurse or myself without having to call the office. It will also allow you   to view your test results. You will need to enroll in MyChart for my office ([facility]) and for the hospital ([place]).  Ray Glacken, MD [facility] A [facility] practice 

## 2022-10-16 NOTE — Discharge Summary (Signed)
Physician Discharge Summary   Patient ID: Grace Dyer MRN: [id number] DOB/AGE: [birth year] 72 y.o.  Admit date: 10/15/2022  Discharge date: 10/16/2022  Discharge Diagnoses:  Principal Problem:   Neoplasm of uncertain behavior of thyroid gland Active Problems:   Multiple thyroid nodules   Discharged Condition: good  Hospital Course: Patient was admitted for observation following thyroid surgery.  Post op course was uncomplicated.  Pain was well controlled.  Tolerated diet.  Post op calcium level on morning following surgery was 9.9 mg/dl.  Patient was prepared for discharge home on POD#1.  Consults: None  Treatments: surgery: total thyroidectomy  Discharge Exam: Blood pressure 134/73, pulse (!) 54, temperature 98.8 F (37.1 C), temperature source Oral, resp. rate 17, height '5\' 3"'$  (1.6 m), weight 70.8 kg, SpO2 100 %. HEENT - clear Neck - wound dry and intact; mild STS; Dermabond in place; voice soft but not hoarse  Disposition: Home  Discharge Instructions     Diet - low sodium heart healthy   Complete by: As directed    Increase activity slowly   Complete by: As directed    No dressing needed   Complete by: As directed       Allergies as of 10/16/2022       Reactions   Codeine Other (See Comments)   DIZZINESS        Medication List     TAKE these medications    ADULT GUMMY PO Take 2 capsules by mouth daily.   calcium carbonate 500 MG chewable tablet Commonly known as: Tums Chew 2 tablets (400 mg of elemental calcium total) by mouth 2 (two) times daily.   HYDROcodone-acetaminophen 5-325 MG tablet Commonly known as: NORCO/VICODIN Take 1 tablet by mouth every 8 (eight) hours as needed for moderate pain.   ibuprofen 200 MG tablet Commonly known as: ADVIL Take 400 mg by mouth every 6 (six) hours as needed for moderate pain.   levothyroxine 100 MCG tablet Commonly known as: Synthroid Take 1 tablet (100 mcg total) by mouth daily before breakfast.    metoprolol succinate 100 MG 24 hr tablet Commonly known as: TOPROL-XL Take 1 tablet (100 mg total) by mouth daily.   metoprolol tartrate 25 MG tablet Commonly known as: LOPRESSOR Take 1 tablet (25 mg total) by mouth 2 (two) times daily as needed for up to 30 doses (palpitations).   NIFEdipine 60 MG 24 hr tablet Commonly known as: ADALAT CC Take 1 tablet (60 mg total) by mouth daily.   NIFEdipine 60 MG 24 hr tablet Commonly known as: PROCARDIA XL/NIFEDICAL XL Take 60 mg by mouth 2 (two) times daily.   oxyCODONE 5 MG immediate release tablet Commonly known as: Oxy IR/ROXICODONE Take 1-2 tablets (5-10 mg total) by mouth every 6 (six) hours as needed for moderate pain.   rosuvastatin 10 MG tablet Commonly known as: CRESTOR Take 10 mg by mouth at bedtime.               Discharge Care Instructions  (From admission, onward)           Start     Ordered   10/16/22 0000  No dressing needed        10/16/22 8546            Follow-up Information     Armandina Gemma, MD. Schedule an appointment as soon as possible for a visit in 3 week(s).   Specialty: General Surgery Why: For wound re-check Contact information: [address]  [address] [place] [address] [phone]                 Deanna Boehlke, [facility] Office: [phone]   Signed: Armandina Gemma 10/16/2022, 9:44 AM

## 2022-10-16 NOTE — Progress Notes (Signed)
Discharge instructions given to patient and all questions were answered.  

## 2022-10-16 NOTE — TOC CM/SW Note (Signed)
Transition of Care [facility]) Screening Note  Patient Details  Name: Grace Dyer Date of Birth: 01/04/1951  Transition of Care [facility]) CM/SW Contact:    Sherie Don, LCSW Phone Number: 10/16/2022, 10:55 AM  Transition of Care Department [facility]) has reviewed patient and no TOC needs have been identified at this time. We will continue to monitor patient advancement through interdisciplinary progression rounds. If new patient transition needs arise, please place a TOC consult.

## 2022-10-21 LAB — SURGICAL PATHOLOGY

## 2022-10-22 NOTE — Progress Notes (Signed)
Good news!  Final pathology is benign!  Grace Dyer - patient does not have MyChart.  Please let her know results.  [place], MD [facility] [place] [facility] Office: [phone]

## 2022-10-29 ENCOUNTER — Encounter: Payer: Self-pay | Admitting: Internal Medicine

## 2022-10-29 ENCOUNTER — Ambulatory Visit: Payer: Medicare HMO | Admitting: Internal Medicine

## 2022-10-29 VITALS — BP 140/80 | HR 56 | Ht 63.0 in | Wt 158.8 lb

## 2022-10-29 DIAGNOSIS — E559 Vitamin D deficiency, unspecified: Secondary | ICD-10-CM | POA: Diagnosis not present

## 2022-10-29 DIAGNOSIS — E213 Hyperparathyroidism, unspecified: Secondary | ICD-10-CM | POA: Diagnosis not present

## 2022-10-29 DIAGNOSIS — E890 Postprocedural hypothyroidism: Secondary | ICD-10-CM

## 2022-10-29 LAB — BASIC METABOLIC PANEL
BUN: 16 mg/dL (ref 6–23)
CO2: 30 mEq/L (ref 19–32)
Calcium: 10.4 mg/dL (ref 8.4–10.5)
Chloride: 105 mEq/L (ref 96–112)
Creatinine, Ser: 0.76 mg/dL (ref 0.40–1.20)
GFR: 78.79 mL/min (ref >60.00)
Glucose, Bld: 112 mg/dL — ABNORMAL HIGH (ref 70–99)
Potassium: 4 mEq/L (ref 3.5–5.1)
Sodium: 142 mEq/L (ref 135–145)

## 2022-10-29 LAB — VITAMIN D 25 HYDROXY (VIT D DEFICIENCY, FRACTURES): VITD: 20.49 ng/mL — ABNORMAL LOW (ref 30.00–100.00)

## 2022-10-29 LAB — TSH: TSH: 7.88 u[IU]/mL — ABNORMAL HIGH (ref 0.35–5.50)

## 2022-10-29 LAB — ALBUMIN: Albumin: 4.1 g/dL (ref 3.5–5.2)

## 2022-10-29 MED ORDER — LEVOTHYROXINE SODIUM 100 MCG PO TABS: 100.0000 ug | ORAL_TABLET | Freq: Every day | ORAL | 2 refills | Status: DC

## 2022-10-29 NOTE — Patient Instructions (Signed)

## 2022-10-29 NOTE — Progress Notes (Signed)
Name: Grace Dyer  MRN/ DOB: [id number], 1951-03-11    Age/ Sex: 72 y.o., female    PCP: Roselee Nova, MD   Reason for Endocrinology Evaluation: MNG/ Hypercalcemia      Date of Initial Endocrinology Evaluation: 01/04/2022    HPI: Grace Dyer is a 72 y.o. female with a past medical history of HTN, cardiomyopathy,SVT and Dyslipidemia . The patient presented for initial endocrinology clinic visit on 01/04/2022 for consultative assistance with her Hypercalcemia .   [name] indicates that she was first diagnosed with hypercalcemia in 2023.   She has no prior history of lithium, or HCTZ use  She denies  history of kidney stones, kidney disease, liver disease, granulomatous disease. She has osteoporosis but no  prior fractures.  She denies  family history of osteoporosis, parathyroid disease, thyroid disease.   She has Hx of MNG , was seen by ENT while living in [place] years ago, no hx of FNa's  in the past  Has occasional local neck swelling   24-hour urine collection for calcium 179 mg on 10/25/2021 DXA 10/2021- normal AP T-score -0.5, 1/3rd forearm -0.9    On her initial visit to our clinic she was noted with low vitamin D, she was started on a OTC vitamin D3 2000 IU daily     THYROID HISTORY: She was diagnosed with multinodular goiter on thyroid ultrasound 04/2022.  She is s/p right mid nodule FNA 05/11/2022 with atypia of undetermined significance (Bethesda categoryIII), with benign Afirma.  She is s/p FNA of the right inferior nodule 05/11/2022 with atypia of undetermined significance (Bethesda category III) with Afirma suspicious   She is s/p total thyroidectomy 10/15/2022 with benign pathology  She was started on LT-for replacement  SUBJECTIVE:     Today (10/29/22): Grace Dyer is here for follow-up on hypercalcemia, hyperparathyroidism, and postoperative hypothyroid   Total thyroidectomy 10/2022 benign  She has been discharged on TUM Weight has  been  fluctuating  She has noted occasional dysphagia  Denies constipation    Vitamin D 2000 IU daily- not taking  Levothyroxine 100 mcg daily   HISTORY:  Past Medical History:  Past Medical History:  Diagnosis Date   Aortic atherosclerosis (HCC)    Cancer ([place])    Thyroid   Dyslipidemia    Dyspnea    HCM   Heart murmur    Hypertension    Hypertrophic cardiomyopathy ([place])    Osteoporosis    Palpitations    Sciatica    Scoliosis    Past Surgical History:   Social History:  reports that she quit smoking about 3 years ago. Her smoking use included cigarettes. She has a 5.00 pack-year smoking history. She has never been exposed to tobacco smoke. She has never used smokeless tobacco. She reports current alcohol use. She reports that she does not use drugs. Family History: family history is not on file.   HOME MEDICATIONS: Allergies as of 10/29/2022       Reactions   Codeine Other (See Comments)   DIZZINESS        Medication List        Accurate as of October 29, 2022  8:14 AM. If you have any questions, ask your nurse or doctor.          ADULT GUMMY PO Take 2 capsules by mouth daily.   calcium carbonate 500 MG chewable tablet Commonly known as: Tums Chew 2 tablets (400 mg of elemental calcium total) by mouth  2 (two) times daily.   HYDROcodone-acetaminophen 5-325 MG tablet Commonly known as: NORCO/VICODIN Take 1 tablet by mouth every 8 (eight) hours as needed for moderate pain.   ibuprofen 200 MG tablet Commonly known as: ADVIL Take 400 mg by mouth every 6 (six) hours as needed for moderate pain.   levothyroxine 100 MCG tablet Commonly known as: Synthroid Take 1 tablet (100 mcg total) by mouth daily before breakfast.   metoprolol succinate 100 MG 24 hr tablet Commonly known as: TOPROL-XL Take 1 tablet (100 mg total) by mouth daily.   metoprolol tartrate 25 MG tablet Commonly known as: LOPRESSOR Take 1 tablet (25 mg total) by mouth 2 (two) times daily as  needed for up to 30 doses (palpitations).   NIFEdipine 60 MG 24 hr tablet Commonly known as: ADALAT CC Take 1 tablet (60 mg total) by mouth daily.   NIFEdipine 60 MG 24 hr tablet Commonly known as: PROCARDIA XL/NIFEDICAL XL Take 60 mg by mouth 2 (two) times daily.   oxyCODONE 5 MG immediate release tablet Commonly known as: Oxy IR/ROXICODONE Take 1-2 tablets (5-10 mg total) by mouth every 6 (six) hours as needed for moderate pain.   rosuvastatin 10 MG tablet Commonly known as: CRESTOR Take 10 mg by mouth at bedtime.          REVIEW OF SYSTEMS: A comprehensive ROS was conducted with the patient and is negative except as per HPI    OBJECTIVE:  VS:BP (!) 140/80 (BP Location: Left Arm, Patient Position: Sitting, Cuff Size: Normal)   Pulse (!) 56   Ht '5\' 3"'$  (1.6 m)   Wt 158 lb 12.8 oz (72 kg)   SpO2 98%   BMI 28.13 kg/m    Wt Readings from Last 3 Encounters:  10/29/22 158 lb 12.8 oz (72 kg)  10/15/22 156 lb 1.4 oz (70.8 kg)  10/12/22 156 lb (70.8 kg)     EXAM: General: Pt appears well and is in NAD  Neck: General: Supple without adenopathy. Thyroid: no  nodule appreciated  Lungs: Clear with good BS bilat with no rales, rhonchi, or wheezes  Heart: Auscultation: RRR. + Murmur   Abdomen: soft, nontender, without masses or organomegaly palpable  Extremities:  BL LE: No pretibial edema normal ROM and strength.  Mental Status: Judgment, insight: Intact Orientation: Oriented to time, place, and person Mood and affect: No depression, anxiety, or agitation     DATA REVIEWED:   Latest Reference Range & Units 10/29/22 08:29  Sodium 135 - 145 mEq/L 142  Potassium 3.5 - 5.1 mEq/L 4.0  Chloride 96 - 112 mEq/L 105  CO2 19 - 32 mEq/L 30  Glucose 70 - 99 mg/dL 112 (H)  BUN 6 - 23 mg/dL 16  Creatinine 0.40 - 1.20 mg/dL 0.76  Calcium 8.4 - 10.5 mg/dL 10.4  Albumin 3.5 - 5.2 g/dL 4.1  GFR >60.00 mL/min 78.79    Latest Reference Range & Units 10/29/22 08:29  VITD 30.00  - 100.00 ng/mL 20.49 (L)     Latest Reference Range & Units 10/29/22 08:29  TSH 0.35 - 5.50 uIU/mL 7.88 (H)       DXA 10/2021  AP t-score -0.5 1/3rd distal forearm -0.9    Thyroid pathology 10/15/2022  FINAL MICROSCOPIC DIAGNOSIS:   A. 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  - DEGENERATIVE CHANGES AND FOCAL CALCIFICATIONS PRESENT  - BIOPSY SITE CHANGES PRESENT  - NEGATIVE FOR MALIGNANCY     ASSESSMENT/PLAN/RECOMMENDATIONS:  Hyperparathyroidism:  -Patient with elevated PTH, serum calcium including ionized calcium is at the upper limit of normal in the past  -Repeat serum calcium is normal on today's labs, PTH pending -24-hour urinary calcium was normal at 179 mg in January 2023 -She had a normal DXA scan in January 2023 including a T score of -0.9 at the distal one third of the forearm -No indication for surgical intervention at this time    Recommendations Avoid OTC calcium tablets Stay hydrated Consume 2-3 servings of calcium daily    2. Postoperative Hypothyroidism:  -She is s/p total thyroidectomy with benign pathology on 10/15/2022 -Patient is clinically euthyroid -Pt educated extensively on the correct way to take levothyroxine (first thing in the morning with water, 30 minutes before eating or taking other medications). - Pt encouraged to double dose the following day if she were to miss a dose given long half-life of levothyroxine. -TSH slightly elevated, no change at this time will repeat in 6 weeks    Medication Levothyroxine 100 mcg daily    3. Vitamin D Insufficieny:  -Patient was advised again to start OTC vitamin D3 at 2000 IU daily     Follow-up in 6 months  Signed electronically by: Mack Guise, MD  [facility]  [facility] [address] [place], [place] [address] Phone: [phone] FAX:  [phone]   CC: Roselee Nova, [place] [address] Phone: [phone] Fax: [phone]   Return to Endocrinology clinic as below: Future Appointments  Date Time Provider [place]  12/04/2022  8:40 AM Werner Lean, MD [facility]  02/19/2023  9:00 AM Debbe Mounts, PhD [facility]

## 2022-10-30 LAB — PARATHYROID HORMONE, INTACT (NO CA): PTH: 63 pg/mL (ref 16–77)

## 2022-12-04 ENCOUNTER — Ambulatory Visit: Payer: Medicare HMO | Admitting: Internal Medicine

## 2022-12-10 ENCOUNTER — Other Ambulatory Visit (INDEPENDENT_AMBULATORY_CARE_PROVIDER_SITE_OTHER): Payer: Medicare HMO

## 2022-12-10 DIAGNOSIS — E890 Postprocedural hypothyroidism: Secondary | ICD-10-CM

## 2022-12-10 LAB — TSH: TSH: 9.03 u[IU]/mL — ABNORMAL HIGH (ref 0.35–5.50)

## 2022-12-11 ENCOUNTER — Telehealth: Payer: Self-pay | Admitting: Internal Medicine

## 2022-12-11 MED ORDER — LEVOTHYROXINE SODIUM 125 MCG PO TABS: 125.0000 ug | ORAL_TABLET | Freq: Every day | ORAL | 3 refills | Status: DC

## 2022-12-11 NOTE — Telephone Encounter (Signed)
Please let the patient know that her thyroid continues to show she is not on enough levothyroxine.   Stop levothyroxine 100 mcg and start levothyroxine 125 mcg daily   Thanks

## 2022-12-11 NOTE — Telephone Encounter (Signed)
Patient advise and will pick up new dose of medication

## 2022-12-19 ENCOUNTER — Encounter: Payer: Medicare HMO | Admitting: Genetic Counselor

## 2022-12-28 ENCOUNTER — Encounter (HOSPITAL_COMMUNITY): Payer: Self-pay

## 2022-12-28 ENCOUNTER — Ambulatory Visit (HOSPITAL_COMMUNITY)
Admission: EM | Admit: 2022-12-28 | Discharge: 2022-12-28 | Disposition: A | Discharge status: Home / Self Care | Payer: Medicare HMO | Attending: Emergency Medicine | Admitting: Emergency Medicine

## 2022-12-28 ENCOUNTER — Ambulatory Visit (INDEPENDENT_AMBULATORY_CARE_PROVIDER_SITE_OTHER): Payer: Medicare HMO

## 2022-12-28 DIAGNOSIS — R059 Cough, unspecified: Secondary | ICD-10-CM | POA: Diagnosis not present

## 2022-12-28 DIAGNOSIS — J189 Pneumonia, unspecified organism: Secondary | ICD-10-CM

## 2022-12-28 DIAGNOSIS — J302 Other seasonal allergic rhinitis: Secondary | ICD-10-CM

## 2022-12-28 MED ORDER — FLUTICASONE PROPIONATE 50 MCG/ACT NA SUSP: 1.0000 | Freq: Every day | NASAL | 1 refills | Status: DC

## 2022-12-28 MED ORDER — GUAIFENESIN 100 MG/5ML PO LIQD: 400.0000 mg | Freq: Three times a day (TID) | ORAL | 0 refills | Status: DC | PRN

## 2022-12-28 MED ORDER — AMOXICILLIN-POT CLAVULANATE 875-125 MG PO TABS: 1.0000 | ORAL_TABLET | Freq: Two times a day (BID) | ORAL | 0 refills | Status: AC

## 2022-12-28 MED ORDER — AZITHROMYCIN 250 MG PO TABS: ORAL_TABLET | ORAL | 0 refills | Status: AC

## 2022-12-28 MED ORDER — LEVOCETIRIZINE DIHYDROCHLORIDE 5 MG PO TABS: 5.0000 mg | ORAL_TABLET | Freq: Every evening | ORAL | 1 refills | Status: AC

## 2022-12-28 NOTE — Discharge Instructions (Addendum)
Your chest x-ray is concerning for pneumonia in your left lower lung field.  Physical exam findings are also concerning for rhinitis causing a lot of postnasal drip which is possibly why you are coughing a lot in the early hours of the morning.    Please read below to learn more about the medications, dosages and frequencies that I recommend to help alleviate your symptoms and to get you feeling better soon:  Augmentin (amoxicillin - clavulanic acid):  Please take one (1) dose twice daily for 5 days.  This antibiotic can cause upset stomach, this will resolve once antibiotics are complete.  You are welcome to take a probiotic, eat yogurt, take Imodium while taking this medication.  Please avoid other systemic medications such as Maalox, Pepto-Bismol or milk of magnesia as they can interfere with the body's ability to absorb the antibiotics.   Z-Pak (azithromycin):  Please take two (2) tablets on day one and one tablet daily thereafter until the prescription is complete.This antibiotic can cause upset stomach, this will resolve once antibiotics are complete.  You are welcome to take a probiotic, eat yogurt, take Imodium while taking this medication.  Please avoid other systemic medications such as Maalox, Pepto-Bismol or milk of magnesia as they can interfere with the body's ability to absorb the antibiotics.       Xyzal (levocetirizine): This is an excellent second-generation antihistamine that helps to reduce respiratory inflammatory response to environmental allergens.  In some patients, this medication can cause daytime sleepiness so I recommend that you take 1 tablet daily at bedtime.     Flonase (fluticasone): This is a steroid nasal spray that used once daily, 1 spray in each nare.  This works best when used on a daily basis. This medication does not work well if it is only used when you think you need it.  After 3 to 5 days of use, you will notice significant reduction of the inflammation and mucus  production that is currently being caused by exposure to allergens, whether seasonal or environmental.  The most common side effect of this medication is nosebleeds.  If you experience a nosebleed, please discontinue use for 1 week, then feel free to resume.  If you find that your insurance will not pay for this medication, please consider a different nasal steroids such as Nasonex (mometasone), or Nasacort (triamcinolone).    Robitussin, Mucinex (guaifenesin): This is an expectorant.  This helps break up chest congestion and loosen up thick nasal drainage making phlegm and drainage more liquid and therefore easier to remove.  I recommend being 400 mg two to three times daily as needed.      Please follow-up within the next 5-7 days either with your primary care provider or urgent care if your symptoms do not resolve.  If you do not have a primary care provider, we will assist you in finding one.        Thank you for visiting urgent care today.  We appreciate the opportunity to participate in your care.

## 2022-12-28 NOTE — ED Triage Notes (Signed)
2- week h/o cough and congestion. Pt reports a recent thyroid on her thyroid.

## 2022-12-28 NOTE — ED Provider Notes (Signed)
[place]    CSN: [id number] Arrival date & time: 12/28/22  1024    HISTORY   Chief Complaint  Patient presents with   Cough   Nasal Congestion   HPI Grace Dyer is a pleasant, 72 y.o. female who presents to urgent care today. Patient is complaining of a 2-week history of nonproductive cough, postnasal drip, rhinorrhea and nasal congestion.  Patient has elevated blood pressure on arrival today with otherwise normal vital signs.  Patient states she has not tried anything to alleviate her symptoms.  The history is provided by the patient.   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   Palpitations    PVC (premature ventricular contraction)    Paroxysmal SVT (supraventricular tachycardia)    Hypertrophic cardiomyopathy ([place]) 04/30/2022   Hyperparathyroidism ([place]) 04/25/2022   Vitamin D deficiency 04/25/2022   Multiple thyroid nodules 04/25/2022   Past Surgical History:  Procedure Laterality Date   ABDOMINAL HYSTERECTOMY  1997   total   HIP ARTHROPLASTY Right 2008   HIP ARTHROPLASTY Left 2013   THYROIDECTOMY N/A 10/15/2022   Procedure: TOTAL THYROIDECTOMY;  Surgeon: Armandina Gemma, MD;  Location: [facility];  Service: General;  Laterality: N/A;   WISDOM TOOTH EXTRACTION     age 25   OB History   No obstetric history on file.    Home Medications    Prior to Admission medications   Medication Sig Start Date End Date Taking? Authorizing Provider  levothyroxine (SYNTHROID) 125 MCG tablet Take 1 tablet (125 mcg total) by mouth daily. 12/11/22  Yes Shamleffer, Melanie Crazier, MD  metoprolol succinate (TOPROL-XL) 100 MG 24 hr  tablet Take 1 tablet (100 mg total) by mouth daily. 02/26/22  Yes Hilty, Nadean Corwin, MD  Multiple Vitamins-Minerals (ADULT GUMMY PO) Take 2 capsules by mouth daily.   Yes [provider]  NIFEdipine (PROCARDIA XL/NIFEDICAL XL) 60 MG 24 hr tablet Take 60 mg by mouth 2 (two) times daily. 08/31/22  Yes [provider]  rosuvastatin (CRESTOR) 10 MG tablet Take 10 mg by mouth at bedtime. 10/24/21  Yes [provider]  metoprolol tartrate (LOPRESSOR) 25 MG tablet Take 1 tablet (25 mg total) by mouth 2 (two) times daily as needed for up to 30 doses (palpitations). 06/06/22   Lennice Sites, DO    Family History History reviewed. No pertinent family history. Social History Social History   Tobacco Use   Smoking status: Former    Packs/day: 0.25    Years: 20.00    Additional pack years: 0.00    Total pack years: 5.00    Types: Cigarettes    Quit date: 2021    Years since quitting: 3.2    Passive exposure: Never   Smokeless tobacco: Never  Vaping Use   Vaping Use: Never used  Substance Use Topics   Alcohol use: Yes    Comment: rare   Drug use: Never   Allergies   Codeine  Review of Systems Review of Systems Pertinent findings revealed after performing a 14 point review of systems has  been noted in the history of present illness.  Physical Exam Vital Signs BP (!) 168/105 (BP Location: Left Arm)   Pulse 69   Temp 99.4 F (37.4 C) (Oral)   Resp 18   SpO2 98%   No data found.  Physical Exam Vitals and nursing note reviewed.  Constitutional:      General: She is awake. She is not in acute distress.    Appearance: Normal appearance. She is well-developed and well-groomed. She is not ill-appearing.  HENT:     Head: Normocephalic and atraumatic.     Salivary Glands: Right salivary gland is diffusely enlarged. Right salivary gland is not tender. Left salivary gland is diffusely enlarged. Left salivary gland is not tender.     Right Ear: Hearing, ear canal  and external ear normal. No drainage. No middle ear effusion. There is no impacted cerumen. Tympanic membrane is bulging. Tympanic membrane is not injected or erythematous.     Left Ear: Hearing, ear canal and external ear normal. No drainage.  No middle ear effusion. There is no impacted cerumen. Tympanic membrane is bulging. Tympanic membrane is not injected or erythematous.     Ears:     Comments: Bilateral EACs normal, both TMs bulging with clear fluid    Nose: Rhinorrhea present. No nasal deformity, septal deviation, signs of injury, laceration, nasal tenderness, mucosal edema or congestion. Rhinorrhea is clear.     Right Nostril: Occlusion present. No foreign body, epistaxis or septal hematoma.     Left Nostril: Occlusion present. No foreign body, epistaxis or septal hematoma.     Right Turbinates: Enlarged, swollen and pale.     Left Turbinates: Enlarged, swollen and pale.     Right Sinus: No maxillary sinus tenderness or frontal sinus tenderness.     Left Sinus: No maxillary sinus tenderness or frontal sinus tenderness.     Mouth/Throat:     Lips: Pink. No lesions.     Mouth: Mucous membranes are moist. No oral lesions.     Pharynx: Oropharynx is clear. Uvula midline. No pharyngeal swelling, oropharyngeal exudate, posterior oropharyngeal erythema or uvula swelling.     Tonsils: No tonsillar exudate or tonsillar abscesses. 0 on the right. 0 on the left.     Comments: Postnasal drip Eyes:     General: Lids are normal.        Right eye: No discharge.        Left eye: No discharge.     Extraocular Movements: Extraocular movements intact.     Conjunctiva/sclera: Conjunctivae normal.     Right eye: Right conjunctiva is not injected.     Left eye: Left conjunctiva is not injected.  Neck:     Trachea: Trachea and phonation normal.  Cardiovascular:     Rate and Rhythm: Normal rate and regular rhythm.     Pulses: Normal pulses.     Heart sounds: Normal heart sounds. No murmur heard.     No friction rub. No gallop.  Pulmonary:     Effort: Pulmonary effort is normal. No tachypnea, bradypnea, accessory muscle usage, prolonged expiration, respiratory distress or retractions.     Breath sounds: No stridor, decreased air movement or transmitted upper airway sounds. Examination of the right-middle field reveals rales. Examination of the right-lower field reveals rales. Rales present. No decreased breath sounds, wheezing or rhonchi.  Chest:     Chest wall: No tenderness.  Musculoskeletal:        General: Normal range of motion.  Cervical back: Normal range of motion and neck supple. Normal range of motion.  Lymphadenopathy:     Cervical: No cervical adenopathy.  Skin:    General: Skin is warm and dry.     Findings: No erythema or rash.  Neurological:     General: No focal deficit present.     Mental Status: She is alert and oriented to person, place, and time.  Psychiatric:        Mood and Affect: Mood normal.        Behavior: Behavior normal. Behavior is cooperative.     Visual Acuity Right Eye Distance:   Left Eye Distance:   Bilateral Distance:    Right Eye Near:   Left Eye Near:    Bilateral Near:     UC Couse / Diagnostics / Procedures:     Radiology DG Chest 2 View  Result Date: 12/28/2022 CLINICAL DATA:  Productive cough x2 weeks EXAM: CHEST - 2 VIEW COMPARISON:  10/12/2022 FINDINGS: Transverse diameter of heart is increased. There are no signs of pulmonary edema. Linear densities are seen in medial left lower lung field. The rest of the lung fields are essentially clear. There is no pleural effusion or pneumothorax. IMPRESSION: New linear densities are seen in medial left lower lung fields suggesting subsegmental atelectasis/pneumonia. Electronically Signed   By: Elmer Picker M.D.   On: 12/28/2022 12:08    Procedures Procedures (including critical care time) EKG  Pending results:  Labs Reviewed - No data to display  Medications Ordered in  UC: Medications - No data to display  UC Diagnoses / Final Clinical Impressions(s)   I have reviewed the triage vital signs and the nursing notes.  Pertinent labs & imaging results that were available during my care of the patient were reviewed by me and considered in my medical decision making (see chart for details).    Final diagnoses:  Seasonal allergic rhinitis, unspecified trigger  Pneumonia of left lower lobe due to infectious organism   Patient advised of chest x-ray findings.  Patient provided with a 5-day course of Augmentin and azithromycin for empiric treatment of presumed bacterial pneumonia in her left lower lobe.  Patient also advised physical exam findings concerning for seasonal allergic rhinitis, allergy medications prescribed for relief of allergy symptoms.  Guaifenesin recommended to promote expectoration.  Conservative care recommended.  Return cautions advised.  Please see discharge instructions below for further details of plan of care as provided to patient. ED Prescriptions     Medication Sig Dispense Auth. Provider   levocetirizine (XYZAL) 5 MG tablet Take 1 tablet (5 mg total) by mouth every evening. 90 tablet Lynden Oxford Scales, PA-C   fluticasone (FLONASE) 50 MCG/ACT nasal spray Place 1 spray into both nostrils daily. Begin by using 2 sprays in each nare daily for 3 to 5 days, then decrease to 1 spray in each nare daily. 31.6 mL Lynden Oxford Scales, PA-C   azithromycin (ZITHROMAX) 250 MG tablet Take 2 tablets (500 mg total) by mouth daily for 1 day, THEN 1 tablet (250 mg total) daily for 4 days. 6 tablet Lynden Oxford Scales, PA-C   guaiFENesin (ROBITUSSIN) 100 MG/5ML liquid Take 20 mLs (400 mg total) by mouth every 8 (eight) hours as needed for cough or to loosen phlegm. 236 mL Lynden Oxford Scales, PA-C   amoxicillin-clavulanate (AUGMENTIN) 875-125 MG tablet Take 1 tablet by mouth 2 (two) times daily for 5 days. 10 tablet Lynden Oxford Scales, PA-C  PDMP not reviewed this encounter.  Disposition Upon Discharge:  Condition: stable for discharge home Home: take medications as prescribed; routine discharge instructions as discussed; follow up as advised.  Patient presented with an acute illness with associated systemic symptoms and significant discomfort requiring urgent management. In my opinion, this is a condition that a prudent lay person (someone who possesses an average knowledge of health and medicine) may potentially expect to result in complications if not addressed urgently such as respiratory distress, impairment of bodily function or dysfunction of bodily organs.   Routine symptom specific, illness specific and/or disease specific instructions were discussed with the patient and/or caregiver at length.   As such, the patient has been evaluated and assessed, work-up was performed and treatment was provided in alignment with urgent care protocols and evidence based medicine.  Patient/parent/caregiver has been advised that the patient may require follow up for further testing and treatment if the symptoms continue in spite of treatment, as clinically indicated and appropriate.  If the patient was tested for COVID-19, Influenza and/or RSV, then the patient/parent/guardian was advised to isolate at home pending the results of his/her diagnostic coronavirus test and potentially longer if they're positive. I have also advised pt that if his/her COVID-19 test returns positive, it's recommended to self-isolate for at least 10 days after symptoms first appeared AND until fever-free for 24 hours without fever reducer AND other symptoms have improved or resolved. Discussed self-isolation recommendations as well as instructions for household member/close contacts as per the [facility] and [place] DHHS, and also gave patient the [facility] packet with this information.  Patient/parent/caregiver has been advised to return to the [facility] or PCP in 3-5 days if no  better; to PCP or the Emergency Department if new signs and symptoms develop, or if the current signs or symptoms continue to change or worsen for further workup, evaluation and treatment as clinically indicated and appropriate  The patient will follow up with their current PCP if and as advised. If the patient does not currently have a PCP we will assist them in obtaining one.   The patient may need specialty follow up if the symptoms continue, in spite of conservative treatment and management, for further workup, evaluation, consultation and treatment as clinically indicated and appropriate.  Patient/parent/caregiver verbalized understanding and agreement of plan as discussed.  All questions were addressed during visit.  Please see discharge instructions below for further details of plan.  Discharge Instructions:   Discharge Instructions      Your chest x-ray is concerning for pneumonia in your left lower lung field.  Physical exam findings are also concerning for rhinitis causing a lot of postnasal drip which is possibly why you are coughing a lot in the early hours of the morning.    Please read below to learn more about the medications, dosages and frequencies that I recommend to help alleviate your symptoms and to get you feeling better soon:  Augmentin (amoxicillin - clavulanic acid):  Please take one (1) dose twice daily for 5 days.  This antibiotic can cause upset stomach, this will resolve once antibiotics are complete.  You are welcome to take a probiotic, eat yogurt, take Imodium while taking this medication.  Please avoid other systemic medications such as Maalox, Pepto-Bismol or milk of magnesia as they can interfere with the body's ability to absorb the antibiotics.   Z-Pak (azithromycin):  Please take two (2) tablets on day one and one tablet daily thereafter until the prescription is complete.This antibiotic  can cause upset stomach, this will resolve once antibiotics are  complete.  You are welcome to take a probiotic, eat yogurt, take Imodium while taking this medication.  Please avoid other systemic medications such as Maalox, Pepto-Bismol or milk of magnesia as they can interfere with the body's ability to absorb the antibiotics.       Xyzal (levocetirizine): This is an excellent second-generation antihistamine that helps to reduce respiratory inflammatory response to environmental allergens.  In some patients, this medication can cause daytime sleepiness so I recommend that you take 1 tablet daily at bedtime.     Flonase (fluticasone): This is a steroid nasal spray that used once daily, 1 spray in each nare.  This works best when used on a daily basis. This medication does not work well if it is only used when you think you need it.  After 3 to 5 days of use, you will notice significant reduction of the inflammation and mucus production that is currently being caused by exposure to allergens, whether seasonal or environmental.  The most common side effect of this medication is nosebleeds.  If you experience a nosebleed, please discontinue use for 1 week, then feel free to resume.  If you find that your insurance will not pay for this medication, please consider a different nasal steroids such as Nasonex (mometasone), or Nasacort (triamcinolone).    Robitussin, Mucinex (guaifenesin): This is an expectorant.  This helps break up chest congestion and loosen up thick nasal drainage making phlegm and drainage more liquid and therefore easier to remove.  I recommend being 400 mg two to three times daily as needed.      Please follow-up within the next 5-7 days either with your primary care provider or urgent care if your symptoms do not resolve.  If you do not have a primary care provider, we will assist you in finding one.        Thank you for visiting urgent care today.  We appreciate the opportunity to participate in your care.       This office note has been  dictated using Museum/gallery curator.  Unfortunately, this method of dictation can sometimes lead to typographical or grammatical errors.  I apologize for your inconvenience in advance if this occurs.  Please do not hesitate to reach out to me if clarification is needed.      Lynden Oxford Scales, [place] 12/31/22 [phone]

## 2023-01-09 ENCOUNTER — Ambulatory Visit: Payer: Medicare HMO | Admitting: Internal Medicine

## 2023-02-12 NOTE — Progress Notes (Unsigned)
Cardiology Office Note:    Date:  02/14/2023   ID:  Grace Dyer, DOB Sep 07, 1951, MRN [id number]  PCP:  Ellyn Hack, MD   [facility] Providers Cardiologist:  None     Referring MD: Ellyn Hack, MD   CC: HCM  History of Present Illness:    Grace Dyer is a 72 y.o. female with a hx of HCM (resting LVOT gradient 29, max thickness 19 mm, ~1% LGE using 6 standard deviation approach).   August found to have an inducible gradient of 70 mm Hg. 2023: She was asymptomatic.  At our last visit she was eating chicken wings during our office visit without post prandial symptoms.  Did not wish to start medication therapy.  Did not wish to start medication or SRT interventions. 2024: since she had her thyroid removed she is taking her metoprolol tartrate PRN more frequently.  Patient notes worse palpitations since prior. This has worsened since her thyroidectomy. She goes to pick up her granddaughter and her ability to walk further distance has improved.   Notes rare fatigue. Notes no CP. Notes no Dizziness. Notes no syncope. Her endocrinologist is titration her   Notable family events include her oldest son (has four children, two girls and two boys) being tested; he has heart troubles NOS.  Past Medical History:  Diagnosis Date   Aortic atherosclerosis (HCC)    Cancer (HCC)    Thyroid   Dyslipidemia    Dyspnea    HCM   Heart murmur    Hypertension    Hypertrophic cardiomyopathy (HCC)    Osteoporosis    Palpitations    Sciatica    Scoliosis     Past Surgical History:  Procedure Laterality Date   ABDOMINAL HYSTERECTOMY  1997   total   HIP ARTHROPLASTY Right 2008   HIP ARTHROPLASTY Left 2013   THYROIDECTOMY N/A 10/15/2022   Procedure: TOTAL THYROIDECTOMY;  Surgeon: Darnell Level, MD;  Location: [facility];  Service: General;  Laterality: N/A;   WISDOM TOOTH EXTRACTION     age 45    Current Medications: Current Meds  Medication Sig   fluticasone  (FLONASE) 50 MCG/ACT nasal spray Place 1 spray into both nostrils daily. Begin by using 2 sprays in each nare daily for 3 to 5 days, then decrease to 1 spray in each nare daily.   levocetirizine (XYZAL) 5 MG tablet Take 1 tablet (5 mg total) by mouth every evening.   levothyroxine (SYNTHROID) 125 MCG tablet Take 1 tablet (125 mcg total) by mouth daily.   metoprolol succinate (TOPROL-XL) 100 MG 24 hr tablet Take 1 tablet (100 mg total) by mouth daily.   metoprolol tartrate (LOPRESSOR) 25 MG tablet Take 1 tablet (25 mg total) by mouth 2 (two) times daily as needed for up to 30 doses (palpitations).   Multiple Vitamins-Minerals (ADULT GUMMY PO) Take 2 capsules by mouth daily.   NIFEdipine (PROCARDIA XL/NIFEDICAL XL) 60 MG 24 hr tablet Take 60 mg by mouth 2 (two) times daily.   rosuvastatin (CRESTOR) 10 MG tablet Take 10 mg by mouth at bedtime.     Allergies:   Codeine   Social History   Socioeconomic History   Marital status: Widowed    Spouse name: Not on file   Number of children: Not on file   Years of education: Not on file   Highest education level: Not on file  Occupational History   Not on file  Tobacco Use   Smoking  status: Former    Packs/day: 0.25    Years: 20.00    Additional pack years: 0.00    Total pack years: 5.00    Types: Cigarettes    Quit date: 2021    Years since quitting: 3.3    Passive exposure: Never   Smokeless tobacco: Never  Vaping Use   Vaping Use: Never used  Substance and Sexual Activity   Alcohol use: Yes    Comment: rare   Drug use: Never   Sexual activity: Not on file  Other Topics Concern   Not on file  Social History Narrative   Not on file   Social Determinants of Health   Financial Resource Strain: Not on file  Food Insecurity: No Food Insecurity (10/15/2022)   Hunger Vital Sign    Worried About Running Out of Food in the Last Year: Never true    Ran Out of Food in the Last Year: Never true  Transportation Needs: No Transportation  Needs (10/15/2022)   PRAPARE - Administrator, Civil Service (Medical): No    Lack of Transportation (Non-Medical): No  Physical Activity: Not on file  Stress: Not on file  Social Connections: Not on file    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     All other systems reviewed and are negative.  EKGs/Labs/Other Studies Reviewed:    Recent Labs: 06/06/2022: ALT 17 10/12/2022: Hemoglobin 12.3; Platelets 214 10/29/2022: BUN 16; Creatinine, Ser 0.76; Potassium 4.0; Sodium 142 12/10/2022: TSH 9.03  Recent Lipid Panel No results found for: "CHOL", "TRIG", "HDL", "CHOLHDL", "VLDL", "LDLCALC", "LDLDIRECT"      Physical Exam:    VS:  BP (!) 140/82   Pulse 91   Ht 5\' 3"  (1.6 m)   Wt 162 lb 3.2 oz (73.6 kg)   SpO2 98%   BMI 28.73 kg/m     Wt Readings from Last 3 Encounters:  02/14/23 162 lb 3.2 oz (73.6 kg)  10/29/22 158 lb 12.8 oz (72 kg)  10/15/22 156 lb 1.4 oz (70.8 kg)    GEN:  Well nourished, well developed in no acute distress HEENT: Normal NECK: No JVD CARDIAC: regular bradycardia with systolic murmur  with hand grip, no rubs, gallops (heart rate 57 on my assessment) RESPIRATORY:  Clear to auscultation without rales, wheezing or rhonchi  ABDOMEN: Soft, non-tender, non-distended MUSCULOSKELETAL:  No edema; No deformity  SKIN: Warm and dry NEUROLOGIC:  Alert and oriented x 3 PSYCHIATRIC:  Normal affect   ASSESSMENT:    1. Hypertrophic cardiomyopathy (HCC)   2. Palpitations   3. PVC (premature ventricular contraction)    PLAN:    Hypertrophic Cardiomyopathy HTN Bradycardia and NSVT - Septal Variant - Inducible gradient of 90 mm Hg - NYHA II  - Family history with no prior HCM or SCD, Discussed family screening  (Genetic eval 02/2023)  CMR notable thickness, minimal LGE using 6 standard deviation method - no ectopy of stress testing; will offer zio patch in  2024  Minimally symptomatic - we will continue metoprolol succinate 100 mg PO Daily and check ziopatch;  - if resting heart rate still low drop nifedipine and consolidate to 150 mg PO Daily of succinate unless asymptomatic - she would like to avoid SRT if possible (see prior notes))  Hypothyroidism - Some symptoms may be from her thyroid disease; she has f/u later this year  Non HCM Cardiac Care: HLD- continue statin fasting lipids next visit HTN- BB plan as above   Six months with me   Medication Adjustments/Labs and Tests Ordered: Current medicines are reviewed at length with the patient today.  Concerns regarding medicines are outlined above.  Orders Placed This Encounter  Procedures   LONG TERM MONITOR (3-14 DAYS)   No orders of the defined types were placed in this encounter.   Patient Instructions  Medication Instructions:  Your physician recommends that you continue on your current medications as directed. Please refer to the Current Medication list given to you today.  *If you need a refill on your cardiac medications before your next appointment, please call your pharmacy*   Lab Work: NONE  If you have labs (blood work) drawn today and your tests are completely normal, you will receive your results only by: MyChart Message (if you have MyChart) OR A paper copy in the mail If you have any lab test that is abnormal or we need to change your treatment, we will call you to review the results.   Testing/Procedures: Your physician has requested that you wear a heart monitor.   Follow-Up: At [facility], you and your health needs are our priority.  As part of our continuing mission to provide you with exceptional heart care, we have created designated Provider Care Teams.  These Care Teams include your primary Cardiologist (physician) and Advanced Practice Providers (APPs -  Physician Assistants and Nurse Practitioners) who all work together to provide you  with the care you need, when you need it.  We recommend signing up for the patient portal called "MyChart".  Sign up information is provided on this After Visit Summary.  MyChart is used to connect with patients for Virtual Visits (Telemedicine).  Patients are able to view lab/test results, encounter notes, upcoming appointments, etc.  Non-urgent messages can be sent to your provider as well.   To learn more about what you can do with MyChart, go to [email or web address].    Your next appointment:   6 month(s)  Provider:   Riley Lam, MD  Other Instructions Christena Deem- Long Term Monitor Instructions  Your physician has requested you wear a ZIO patch monitor for 7 days.  This is a single patch monitor. [employer] supplies one patch monitor per enrollment. Additional stickers are not available. Please do not apply patch if you will be having a Nuclear Stress Test,   Cardiac CT, MRI, or Chest Xray during the period you would be wearing the  monitor. The patch cannot be worn during these tests. You cannot remove and re-apply the  ZIO XT patch monitor.  Your ZIO patch monitor will be mailed 3 day USPS to your address on file. It may take 3-5 days  to receive your monitor after you have been enrolled.  Once you have received your monitor, please review the enclosed instructions. Your monitor  has already been registered assigning a specific monitor serial # to you.  Billing and Patient Assistance Program Information  We have supplied [employer] with any of your insurance information on file for billing purposes. [employer] offers a sliding scale Patient Assistance Program for patients that do not have  insurance, or whose insurance does not completely cover the cost of the ZIO monitor.  You must apply for the Patient Assistance Program to qualify for this discounted rate.  To apply, please call [employer] at [phone], select option 4, select option 2, ask to apply for  Patient Assistance  Program. Meredeth Ide will ask your household income, and how many people  are in your household. They will quote your out-of-pocket cost based on that information.  [employer] will also be able to set up a 18-month, interest-free payment plan if needed.  Applying the monitor   Shave hair from upper left chest.  Hold abrader disc by orange tab. Rub abrader in 40 strokes over the upper left chest as  indicated in your monitor instructions.  Clean area with 4 enclosed alcohol pads. Let dry.  Apply patch as indicated in monitor instructions. Patch will be placed under collarbone on left  side of chest with arrow pointing upward.  Rub patch adhesive wings for 2 minutes. Remove white label marked "1". Remove the white  label marked "2". Rub patch adhesive wings for 2 additional minutes.  While looking in a mirror, press and release button in center of patch. A small green light will  flash 3-4 times. This will be your only indicator that the monitor has been turned on.  Do not shower for the first 24 hours. You may shower after the first 24 hours.  Press the button if you feel a symptom. You will hear a small click. Record Date, Time and  Symptom in the Patient Logbook.  When you are ready to remove the patch, follow instructions on the last 2 pages of Patient  Logbook. Stick patch monitor onto the last page of Patient Logbook.  Place Patient Logbook in the blue and white box. Use locking tab on box and tape box closed  securely. The blue and white box has prepaid postage on it. Please place it in the mailbox as  soon as possible. Your physician should have your test results approximately 7 days after the  monitor has been mailed back to [facility].  Call [facility] Customer Care at [phone] if you have questions regarding  your ZIO XT patch monitor. Call them immediately if you see an orange light blinking on your  monitor.  If your monitor falls off in less than 4 days, contact our  Monitor department at [phone].  If your monitor becomes loose or falls off after 4 days call [employer] at [phone] for  suggestions on securing your monitor     Signed, Christell Constant, MD  02/14/2023 8:25 AM    [facility]

## 2023-02-14 ENCOUNTER — Ambulatory Visit: Payer: Medicare HMO | Attending: Internal Medicine | Admitting: Internal Medicine

## 2023-02-14 ENCOUNTER — Encounter: Payer: Self-pay | Admitting: Internal Medicine

## 2023-02-14 ENCOUNTER — Ambulatory Visit (INDEPENDENT_AMBULATORY_CARE_PROVIDER_SITE_OTHER): Payer: Medicare HMO

## 2023-02-14 VITALS — BP 140/82 | HR 91 | Ht 63.0 in | Wt 162.2 lb

## 2023-02-14 DIAGNOSIS — R002 Palpitations: Secondary | ICD-10-CM

## 2023-02-14 DIAGNOSIS — I422 Other hypertrophic cardiomyopathy: Secondary | ICD-10-CM

## 2023-02-14 DIAGNOSIS — I493 Ventricular premature depolarization: Secondary | ICD-10-CM | POA: Diagnosis not present

## 2023-02-14 NOTE — Progress Notes (Unsigned)
Enrolled patient for a 7 day Zio XT monitor to be mailed to patients home.  

## 2023-02-14 NOTE — Patient Instructions (Signed)
Medication Instructions:  Your physician recommends that you continue on your current medications as directed. Please refer to the Current Medication list given to you today.  *If you need a refill on your cardiac medications before your next appointment, please call your pharmacy*   Lab Work: NONE  If you have labs (blood work) drawn today and your tests are completely normal, you will receive your results only by: MyChart Message (if you have MyChart) OR A paper copy in the mail If you have any lab test that is abnormal or we need to change your treatment, we will call you to review the results.   Testing/Procedures: Your physician has requested that you wear a heart monitor.   Follow-Up: At [facility], you and your health needs are our priority.  As part of our continuing mission to provide you with exceptional heart care, we have created designated Provider Care Teams.  These Care Teams include your primary Cardiologist (physician) and Advanced Practice Providers (APPs -  Physician Assistants and Nurse Practitioners) who all work together to provide you with the care you need, when you need it.  We recommend signing up for the patient portal called "MyChart".  Sign up information is provided on this After Visit Summary.  MyChart is used to connect with patients for Virtual Visits (Telemedicine).  Patients are able to view lab/test results, encounter notes, upcoming appointments, etc.  Non-urgent messages can be sent to your provider as well.   To learn more about what you can do with MyChart, go to [email or web address].    Your next appointment:   6 month(s)  Provider:   Riley Lam, MD  Other Instructions Christena Deem- Long Term Monitor Instructions  Your physician has requested you wear a ZIO patch monitor for 7 days.  This is a single patch monitor. [employer] supplies one patch monitor per enrollment. Additional stickers are not available. Please do not apply  patch if you will be having a Nuclear Stress Test,   Cardiac CT, MRI, or Chest Xray during the period you would be wearing the  monitor. The patch cannot be worn during these tests. You cannot remove and re-apply the  ZIO XT patch monitor.  Your ZIO patch monitor will be mailed 3 day USPS to your address on file. It may take 3-5 days  to receive your monitor after you have been enrolled.  Once you have received your monitor, please review the enclosed instructions. Your monitor  has already been registered assigning a specific monitor serial # to you.  Billing and Patient Assistance Program Information  We have supplied [employer] with any of your insurance information on file for billing purposes. [employer] offers a sliding scale Patient Assistance Program for patients that do not have  insurance, or whose insurance does not completely cover the cost of the ZIO monitor.  You must apply for the Patient Assistance Program to qualify for this discounted rate.  To apply, please call [employer] at [phone], select option 4, select option 2, ask to apply for  Patient Assistance Program. Meredeth Ide will ask your household income, and how many people  are in your household. They will quote your out-of-pocket cost based on that information.  [employer] will also be able to set up a 53-month, interest-free payment plan if needed.  Applying the monitor   Shave hair from upper left chest.  Hold abrader disc by orange tab. Rub abrader in 40 strokes over the upper left chest as  indicated in your  monitor instructions.  Clean area with 4 enclosed alcohol pads. Let dry.  Apply patch as indicated in monitor instructions. Patch will be placed under collarbone on left  side of chest with arrow pointing upward.  Rub patch adhesive wings for 2 minutes. Remove white label marked "1". Remove the white  label marked "2". Rub patch adhesive wings for 2 additional minutes.  While looking in a mirror, press and release  button in center of patch. A small green light will  flash 3-4 times. This will be your only indicator that the monitor has been turned on.  Do not shower for the first 24 hours. You may shower after the first 24 hours.  Press the button if you feel a symptom. You will hear a small click. Record Date, Time and  Symptom in the Patient Logbook.  When you are ready to remove the patch, follow instructions on the last 2 pages of Patient  Logbook. Stick patch monitor onto the last page of Patient Logbook.  Place Patient Logbook in the blue and white box. Use locking tab on box and tape box closed  securely. The blue and white box has prepaid postage on it. Please place it in the mailbox as  soon as possible. Your physician should have your test results approximately 7 days after the  monitor has been mailed back to [facility].  Call [facility] Customer Care at [phone] if you have questions regarding  your ZIO XT patch monitor. Call them immediately if you see an orange light blinking on your  monitor.  If your monitor falls off in less than 4 days, contact our Monitor department at [phone].  If your monitor becomes loose or falls off after 4 days call [employer] at [phone] for  suggestions on securing your monitor

## 2023-02-15 ENCOUNTER — Encounter: Payer: Self-pay | Admitting: Nurse Practitioner

## 2023-02-15 ENCOUNTER — Other Ambulatory Visit: Payer: Self-pay | Admitting: Nurse Practitioner

## 2023-02-15 DIAGNOSIS — E2839 Other primary ovarian failure: Secondary | ICD-10-CM

## 2023-02-19 ENCOUNTER — Encounter: Payer: Medicare HMO | Admitting: Genetic Counselor

## 2023-02-19 ENCOUNTER — Ambulatory Visit: Payer: Medicare HMO | Attending: Genetic Counselor | Admitting: Genetic Counselor

## 2023-02-19 DIAGNOSIS — R002 Palpitations: Secondary | ICD-10-CM | POA: Diagnosis not present

## 2023-02-19 DIAGNOSIS — I422 Other hypertrophic cardiomyopathy: Secondary | ICD-10-CM

## 2023-02-24 NOTE — Consult Note (Signed)
Pre Test Genetic Consult  Referring Provider: Riley Lam, MD  Referral Reason  Grace Dyer, a new patient at the [facility] clinic, is referred for genetic consult and testing of hypertrophic cardiomyopathy.   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 She tells me that she moved to [facility] in 2022 from [place], [place] and established care with a new PCP. At her initial checkup, he detected cardiac issues and referred her to Dr. Izora Ribas for further evaluation. 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   She tells me that she was seeing a cardiologist at [facility] in [place] but was never told of having HCM.    She reports having symptoms of fatigue and chest discomfort sometimes, heart palpitations and dizziness for the last 2 years. Denies having dyspnea and syncope. She informs me of playing sports in her childhood and youth and having no functional limitations at that time.  Traditional Risk Factors Grace Dyer reports having borderline HTN at 45.  Family history  Relation to Proband Pedigree # Current age Heart condition/age of onset Notes  Sons, 2 IV.1, IV.3 53, 39 IV.1- heart palpitations Normal Echo/EKG at 74  Daughters, 2 IV.2, IV.4 52, 35 None Echo/EKG not done  Grandchildren V.1-V.11 33- 6 None         Father II.1 Deceased Unaware Not in touch w/ father or his family        Mother II.2 Deceased None Died @ 40 from massive M.I. Stroke @ 55s  Maternal uncle II.3 Deceased None Died @ 62 from massive M.I.   Maternal grandfather I.3 Deceased Unaware Not in touch w/ her family  Maternal grandmother I.4 Deceased None Died at 72 from stroke    Genetics [name] was counseled on the genetics of hypertrophic cardiomyopathy (HCM). I  explained to the patient that this is an autosomal dominant condition with incomplete penetrance i.e. not all individuals harboring the HCM mutation will present clinically with HCM, and age-related penetrance where clinical presentation of HCM increases with advanced age. Variability in clinical expression is also seen in families with HCM with affected family members presenting clinically at different ages and with symptoms ranging from mild to severe.  Since HCM is an autosomal dominant condition, first degree-relatives should seek regular surveillance for HCM.  Her first-degree relatives include her four children. She does not have full or half-siblings. Clinical screening of first-degree relatives involves echocardiogram and EKG at regular intervals, frequency is typically determined by age, with those in their teens undergoing screening every year and those over the age of 44 getting screened every 3-5 years. Patient verbalized understanding of this.  I informed the patient that about 8-10% of HCM patients can have compound and digenic mutations for HCM. Also briefly discussed the inheritance pattern and treatment /management plans for the infiltrative cardiomyopathies that present as HCM phenocopies.   Genetic testing is a probabilistic test dependent upon age and severity of presentation, presence of risk factors for HCM and importantly family history of HCM or sudden death in first-degree relatives.   The potential outcomes of genetic testing and subsequent management of at-risk family members are described below-  If a mutation is not identified, then all first-degree relatives should undergo regular screening for HCM. I emphasized that even if the genetic test is negative, it  does not mean that the patient does not have HCM. A negative test result can be due to limitations of the genetic test.   There is also the likelihood of identifying a "Variant of unknown significance". This result means  that the variant has not been detected in a statistically significant number of HCM patients and/or functional studies have not been performed to verify its pathogenicity. This VUS can be tested in the family to see if it segregates with disease. If a VUS is found, first-degree relatives should undergo regular clinical screening for HCM.  If a pathogenic variant is reported, then first-degree family members can get tested for this variant. If they test positive, it is likely they will develop HCM. In light of variable expression and incomplete penetrance associated with HCM, it is not possible to predict when they will manifest clinically with HCM. It is recommended that family members that test positive for the familial pathogenic variant pursue clinical screening for HCM. Family members that test negative for the familial mutation need not pursue periodic screening for HCM, but seek care if symptoms develop.   Impression  Grace Dyer was found to have cardiac wall thickness suggestive of HCM at age 76. However, there is no family history of HCM or sudden death amongst her first-degree relatives. Based on patient's family tree, it is possible that she has a de novo mutation for HCM or has inherited it from her paternal lineage as she does not know much about their family history.  Genetic testing for HCM is recommended. This test should include the sarcomeric genes implicated in HCM as well as the genes for the HCM phenocopies, such as Fabry disease, Danon disease, WPW syndrome and familial transthyretin amyloidosis.  In addition, patient should be aware of protections afforded by the Genetic Information Non-Discrimination Act (GINA). GINA protects them from losing their employment or health insurance based on their genotype. However, these protections do not cover life insurance and disability.  Please note that the patient has not been counseled in this visit on personal, cultural or ethical issues that  they may face due to their heart condition.   Plan After a thorough discussion of the risk and benefits of genetic testing for HCM, Meilah declines genetic testing for HCM due to insurance non-coverage. She will discuss the HCM screening guidelines with her children so they can avail regular screening by echocardiogram and EKG.   Sidney Ace, Ph.D, [facility] Clinical Molecular Geneticist

## 2023-04-14 ENCOUNTER — Other Ambulatory Visit: Payer: Self-pay

## 2023-04-14 ENCOUNTER — Emergency Department (HOSPITAL_COMMUNITY): Payer: Medicare HMO | Admitting: Anesthesiology

## 2023-04-14 ENCOUNTER — Emergency Department (HOSPITAL_COMMUNITY): Payer: Medicare HMO

## 2023-04-14 ENCOUNTER — Encounter (HOSPITAL_COMMUNITY): Payer: Self-pay

## 2023-04-14 ENCOUNTER — Encounter (HOSPITAL_COMMUNITY): Admission: RE | Disposition: A | Discharge status: Home Health | Payer: Self-pay | Source: Home / Self Care

## 2023-04-14 ENCOUNTER — Inpatient Hospital Stay (HOSPITAL_COMMUNITY)
Admission: RE | Admit: 2023-04-14 | Discharge: 2023-04-21 | DRG: 330 | Disposition: A | Discharge status: Home Health | Payer: Medicare HMO | Attending: General Surgery | Admitting: General Surgery

## 2023-04-14 DIAGNOSIS — K469 Unspecified abdominal hernia without obstruction or gangrene: Principal | ICD-10-CM | POA: Diagnosis present

## 2023-04-14 DIAGNOSIS — K562 Volvulus: Secondary | ICD-10-CM | POA: Diagnosis present

## 2023-04-14 DIAGNOSIS — Z85850 Personal history of malignant neoplasm of thyroid: Secondary | ICD-10-CM

## 2023-04-14 DIAGNOSIS — Z885 Allergy status to narcotic agent status: Secondary | ICD-10-CM | POA: Diagnosis not present

## 2023-04-14 DIAGNOSIS — I10 Essential (primary) hypertension: Secondary | ICD-10-CM | POA: Diagnosis present

## 2023-04-14 DIAGNOSIS — I422 Other hypertrophic cardiomyopathy: Secondary | ICD-10-CM | POA: Diagnosis present

## 2023-04-14 DIAGNOSIS — Z79890 Hormone replacement therapy (postmenopausal): Secondary | ICD-10-CM

## 2023-04-14 DIAGNOSIS — K458 Other specified abdominal hernia without obstruction or gangrene: Secondary | ICD-10-CM | POA: Diagnosis present

## 2023-04-14 DIAGNOSIS — K659 Peritonitis, unspecified: Secondary | ICD-10-CM | POA: Diagnosis present

## 2023-04-14 DIAGNOSIS — I471 Supraventricular tachycardia, unspecified: Secondary | ICD-10-CM

## 2023-04-14 DIAGNOSIS — Z96643 Presence of artificial hip joint, bilateral: Secondary | ICD-10-CM | POA: Diagnosis present

## 2023-04-14 DIAGNOSIS — Z87891 Personal history of nicotine dependence: Secondary | ICD-10-CM

## 2023-04-14 DIAGNOSIS — I700 Atherosclerosis of aorta: Secondary | ICD-10-CM | POA: Diagnosis present

## 2023-04-14 DIAGNOSIS — M419 Scoliosis, unspecified: Secondary | ICD-10-CM | POA: Diagnosis present

## 2023-04-14 DIAGNOSIS — K660 Peritoneal adhesions (postprocedural) (postinfection): Secondary | ICD-10-CM | POA: Diagnosis present

## 2023-04-14 DIAGNOSIS — E785 Hyperlipidemia, unspecified: Secondary | ICD-10-CM | POA: Diagnosis present

## 2023-04-14 DIAGNOSIS — R1084 Generalized abdominal pain: Principal | ICD-10-CM

## 2023-04-14 HISTORY — PX: LAPAROTOMY: SHX154

## 2023-04-14 LAB — URINALYSIS, ROUTINE W REFLEX MICROSCOPIC
Bacteria, UA: NONE SEEN
Bilirubin Urine: NEGATIVE
Glucose, UA: 150 mg/dL — AB
Hgb urine dipstick: NEGATIVE
Ketones, ur: 5 mg/dL — AB
Leukocytes,Ua: NEGATIVE
Nitrite: NEGATIVE
Protein, ur: 30 mg/dL — AB
Specific Gravity, Urine: 1.018 (ref 1.005–1.030)
pH: 7 (ref 5.0–8.0)

## 2023-04-14 LAB — CBC WITH DIFFERENTIAL/PLATELET
Abs Immature Granulocytes: 0.04 10*3/uL (ref 0.00–0.07)
Basophils Absolute: 0 10*3/uL (ref 0.0–0.1)
Basophils Relative: 0 %
Eosinophils Absolute: 0.1 10*3/uL (ref 0.0–0.5)
Eosinophils Relative: 1 %
HCT: 38.4 % (ref 36.0–46.0)
Hemoglobin: 13.2 g/dL (ref 12.0–15.0)
Immature Granulocytes: 0 %
Lymphocytes Relative: 11 %
Lymphs Abs: 1.2 10*3/uL (ref 0.7–4.0)
MCH: 32.8 pg (ref 26.0–34.0)
MCHC: 34.4 g/dL (ref 30.0–36.0)
MCV: 95.5 fL (ref 80.0–100.0)
Monocytes Absolute: 0.8 10*3/uL (ref 0.1–1.0)
Monocytes Relative: 7 %
Neutro Abs: 8.5 10*3/uL — ABNORMAL HIGH (ref 1.7–7.7)
Neutrophils Relative %: 81 %
Platelets: 211 10*3/uL (ref 150–400)
RBC: 4.02 MIL/uL (ref 3.87–5.11)
RDW: 13.9 % (ref 11.5–15.5)
WBC: 10.6 10*3/uL — ABNORMAL HIGH (ref 4.0–10.5)
nRBC: 0 % (ref 0.0–0.2)

## 2023-04-14 LAB — COMPREHENSIVE METABOLIC PANEL
ALT: 24 U/L (ref 0–44)
AST: 24 U/L (ref 15–41)
Albumin: 3.8 g/dL (ref 3.5–5.0)
Alkaline Phosphatase: 88 U/L (ref 38–126)
Anion gap: 10 (ref 5–15)
BUN: 20 mg/dL (ref 8–23)
CO2: 23 mmol/L (ref 22–32)
Calcium: 9.6 mg/dL (ref 8.9–10.3)
Chloride: 104 mmol/L (ref 98–111)
Creatinine, Ser: 0.82 mg/dL (ref 0.44–1.00)
GFR, Estimated: >60 mL/min (ref >60)
Glucose, Bld: 221 mg/dL — ABNORMAL HIGH (ref 70–99)
Potassium: 3.6 mmol/L (ref 3.5–5.1)
Sodium: 137 mmol/L (ref 135–145)
Total Bilirubin: 1.6 mg/dL — ABNORMAL HIGH (ref 0.3–1.2)
Total Protein: 7.4 g/dL (ref 6.5–8.1)

## 2023-04-14 LAB — POCT I-STAT 7, (LYTES, BLD GAS, ICA,H+H)
Acid-base deficit: 3 mmol/L — ABNORMAL HIGH (ref 0.0–2.0)
Bicarbonate: 22.8 mmol/L (ref 20.0–28.0)
Calcium, Ion: 1.2 mmol/L (ref 1.15–1.40)
HCT: 42 % (ref 36.0–46.0)
Hemoglobin: 14.3 g/dL (ref 12.0–15.0)
O2 Saturation: 100 %
Patient temperature: 36
Potassium: 3.8 mmol/L (ref 3.5–5.1)
Sodium: 136 mmol/L (ref 135–145)
TCO2: 24 mmol/L (ref 22–32)
pCO2 arterial: 39.5 mmHg (ref 32–48)
pH, Arterial: 7.364 (ref 7.35–7.45)
pO2, Arterial: 511 mmHg — ABNORMAL HIGH (ref 83–108)

## 2023-04-14 LAB — I-STAT CHEM 8, ED
BUN: 19 mg/dL (ref 8–23)
Calcium, Ion: 1.24 mmol/L (ref 1.15–1.40)
Chloride: 105 mmol/L (ref 98–111)
Creatinine, Ser: 0.8 mg/dL (ref 0.44–1.00)
Glucose, Bld: 224 mg/dL — ABNORMAL HIGH (ref 70–99)
HCT: 40 % (ref 36.0–46.0)
Hemoglobin: 13.6 g/dL (ref 12.0–15.0)
Potassium: 3.6 mmol/L (ref 3.5–5.1)
Sodium: 138 mmol/L (ref 135–145)
TCO2: 25 mmol/L (ref 22–32)

## 2023-04-14 LAB — TYPE AND SCREEN
ABO/RH(D): A POS
Antibody Screen: NEGATIVE

## 2023-04-14 LAB — LACTIC ACID, PLASMA: Lactic Acid, Venous: 1.4 mmol/L (ref 0.5–1.9)

## 2023-04-14 LAB — ABO/RH: ABO/RH(D): A POS

## 2023-04-14 LAB — LIPASE, BLOOD: Lipase: 30 U/L (ref 11–51)

## 2023-04-14 SURGERY — LAPAROTOMY, EXPLORATORY
Anesthesia: General

## 2023-04-14 MED ORDER — PIPERACILLIN-TAZOBACTAM 3.375 G IVPB 30 MIN
3.3750 g | Freq: Once | INTRAVENOUS | Status: AC
  Administered 2023-04-14: 3.375 g via INTRAVENOUS
  Filled 2023-04-14: qty 50

## 2023-04-14 MED ORDER — DEXAMETHASONE SODIUM PHOSPHATE 10 MG/ML IJ SOLN
INTRAMUSCULAR | Status: AC
  Filled 2023-04-14: qty 1

## 2023-04-14 MED ORDER — PROPOFOL 10 MG/ML IV BOLUS
INTRAVENOUS | Status: DC | PRN
  Administered 2023-04-14: 90 mg via INTRAVENOUS

## 2023-04-14 MED ORDER — METOPROLOL SUCCINATE ER 100 MG PO TB24
100.0000 mg | ORAL_TABLET | Freq: Every day | ORAL | Status: DC
  Administered 2023-04-14 – 2023-04-21 (×8): 100 mg via ORAL
  Filled 2023-04-14 (×8): qty 1

## 2023-04-14 MED ORDER — OXYCODONE HCL 5 MG PO TABS
5.0000 mg | ORAL_TABLET | ORAL | Status: DC | PRN
  Administered 2023-04-14 – 2023-04-21 (×6): 10 mg via ORAL
  Filled 2023-04-14 (×6): qty 2

## 2023-04-14 MED ORDER — SUCCINYLCHOLINE CHLORIDE 200 MG/10ML IV SOSY
PREFILLED_SYRINGE | INTRAVENOUS | Status: AC
  Filled 2023-04-14: qty 10

## 2023-04-14 MED ORDER — HYDRALAZINE HCL 20 MG/ML IJ SOLN
INTRAMUSCULAR | Status: DC | PRN
  Administered 2023-04-14 (×3): 4 mg via INTRAVENOUS

## 2023-04-14 MED ORDER — NIFEDIPINE ER OSMOTIC RELEASE 60 MG PO TB24
60.0000 mg | ORAL_TABLET | Freq: Two times a day (BID) | ORAL | Status: DC
  Administered 2023-04-14 – 2023-04-19 (×11): 60 mg via ORAL
  Filled 2023-04-14 (×15): qty 1

## 2023-04-14 MED ORDER — MORPHINE SULFATE (PF) 2 MG/ML IV SOLN
2.0000 mg | INTRAVENOUS | Status: DC | PRN
  Administered 2023-04-14 – 2023-04-17 (×8): 2 mg via INTRAVENOUS
  Filled 2023-04-14 (×9): qty 1

## 2023-04-14 MED ORDER — AMISULPRIDE (ANTIEMETIC) 5 MG/2ML IV SOLN: 10.0000 mg | Freq: Once | INTRAVENOUS | Status: DC | PRN

## 2023-04-14 MED ORDER — ONDANSETRON HCL 4 MG/2ML IJ SOLN
INTRAMUSCULAR | Status: AC
  Filled 2023-04-14: qty 2

## 2023-04-14 MED ORDER — METOPROLOL TARTRATE 5 MG/5ML IV SOLN
5.0000 mg | Freq: Four times a day (QID) | INTRAVENOUS | Status: DC | PRN
  Filled 2023-04-14: qty 5

## 2023-04-14 MED ORDER — SODIUM CHLORIDE 0.9 % IV BOLUS
1000.0000 mL | Freq: Once | INTRAVENOUS | Status: AC
  Administered 2023-04-14: 1000 mL via INTRAVENOUS

## 2023-04-14 MED ORDER — SIMETHICONE 80 MG PO CHEW: 40.0000 mg | CHEWABLE_TABLET | Freq: Four times a day (QID) | ORAL | Status: DC | PRN

## 2023-04-14 MED ORDER — ONDANSETRON HCL 4 MG/2ML IJ SOLN
4.0000 mg | Freq: Four times a day (QID) | INTRAMUSCULAR | Status: DC | PRN
  Administered 2023-04-16 – 2023-04-17 (×3): 4 mg via INTRAVENOUS
  Filled 2023-04-14 (×3): qty 2

## 2023-04-14 MED ORDER — 0.9 % SODIUM CHLORIDE (POUR BTL) OPTIME
TOPICAL | Status: DC | PRN
  Administered 2023-04-14 (×2): 1000 mL

## 2023-04-14 MED ORDER — ONDANSETRON HCL 4 MG/2ML IJ SOLN
4.0000 mg | Freq: Once | INTRAMUSCULAR | Status: AC
  Administered 2023-04-14: 4 mg via INTRAVENOUS
  Filled 2023-04-14: qty 2

## 2023-04-14 MED ORDER — PROPOFOL 10 MG/ML IV BOLUS
INTRAVENOUS | Status: AC
  Filled 2023-04-14: qty 20

## 2023-04-14 MED ORDER — SUGAMMADEX SODIUM 200 MG/2ML IV SOLN
INTRAVENOUS | Status: DC | PRN
  Administered 2023-04-14: 100 mg via INTRAVENOUS
  Administered 2023-04-14: 200 mg via INTRAVENOUS

## 2023-04-14 MED ORDER — HYDROMORPHONE HCL 1 MG/ML IJ SOLN
1.0000 mg | Freq: Once | INTRAMUSCULAR | Status: AC
  Administered 2023-04-14: 1 mg via INTRAVENOUS
  Filled 2023-04-14: qty 1

## 2023-04-14 MED ORDER — HYDROMORPHONE HCL 1 MG/ML IJ SOLN: 0.5000 mg | Freq: Once | INTRAMUSCULAR | Status: DC

## 2023-04-14 MED ORDER — VASOPRESSIN 20 UNIT/ML IV SOLN
INTRAVENOUS | Status: AC
  Filled 2023-04-14: qty 1

## 2023-04-14 MED ORDER — LORATADINE 10 MG PO TABS
5.0000 mg | ORAL_TABLET | Freq: Every evening | ORAL | Status: DC
  Administered 2023-04-14 – 2023-04-20 (×7): 5 mg via ORAL
  Filled 2023-04-14 (×7): qty 1

## 2023-04-14 MED ORDER — PHENYLEPHRINE 80 MCG/ML (10ML) SYRINGE FOR IV PUSH (FOR BLOOD PRESSURE SUPPORT)
PREFILLED_SYRINGE | INTRAVENOUS | Status: DC | PRN
  Administered 2023-04-14 (×2): 80 ug via INTRAVENOUS

## 2023-04-14 MED ORDER — LACTATED RINGERS IV SOLN: INTRAVENOUS | Status: DC | PRN

## 2023-04-14 MED ORDER — SUCCINYLCHOLINE CHLORIDE 200 MG/10ML IV SOSY
PREFILLED_SYRINGE | INTRAVENOUS | Status: DC | PRN
  Administered 2023-04-14: 120 mg via INTRAVENOUS

## 2023-04-14 MED ORDER — LIDOCAINE 2% (20 MG/ML) 5 ML SYRINGE
INTRAMUSCULAR | Status: DC | PRN
  Administered 2023-04-14: 80 mg via INTRAVENOUS

## 2023-04-14 MED ORDER — LIDOCAINE 2% (20 MG/ML) 5 ML SYRINGE
INTRAMUSCULAR | Status: AC
  Filled 2023-04-14: qty 5

## 2023-04-14 MED ORDER — HYDROMORPHONE HCL 1 MG/ML IJ SOLN
INTRAMUSCULAR | Status: AC
  Filled 2023-04-14: qty 1

## 2023-04-14 MED ORDER — ACETAMINOPHEN 500 MG PO TABS
1000.0000 mg | ORAL_TABLET | Freq: Four times a day (QID) | ORAL | Status: DC
  Administered 2023-04-14 – 2023-04-15 (×3): 1000 mg via ORAL
  Filled 2023-04-14 (×3): qty 2

## 2023-04-14 MED ORDER — KCL IN DEXTROSE-NACL 20-5-0.45 MEQ/L-%-% IV SOLN
INTRAVENOUS | Status: DC
  Filled 2023-04-14 (×11): qty 1000

## 2023-04-14 MED ORDER — CEFAZOLIN SODIUM-DEXTROSE 2-4 GM/100ML-% IV SOLN
2.0000 g | Freq: Once | INTRAVENOUS | Status: AC
  Administered 2023-04-14: 2 g via INTRAVENOUS
  Filled 2023-04-14: qty 100

## 2023-04-14 MED ORDER — FENTANYL CITRATE (PF) 250 MCG/5ML IJ SOLN
INTRAMUSCULAR | Status: AC
  Filled 2023-04-14: qty 5

## 2023-04-14 MED ORDER — ROSUVASTATIN CALCIUM 5 MG PO TABS
10.0000 mg | ORAL_TABLET | Freq: Every day | ORAL | Status: DC
  Administered 2023-04-14 – 2023-04-20 (×7): 10 mg via ORAL
  Filled 2023-04-14 (×7): qty 2

## 2023-04-14 MED ORDER — SODIUM CHLORIDE 0.9 % IV SOLN: INTRAVENOUS | Status: DC | PRN

## 2023-04-14 MED ORDER — ROCURONIUM BROMIDE 10 MG/ML (PF) SYRINGE
PREFILLED_SYRINGE | INTRAVENOUS | Status: DC | PRN
  Administered 2023-04-14: 50 mg via INTRAVENOUS

## 2023-04-14 MED ORDER — ONDANSETRON 4 MG PO TBDP: 4.0000 mg | ORAL_TABLET | Freq: Four times a day (QID) | ORAL | Status: DC | PRN

## 2023-04-14 MED ORDER — PHENYLEPHRINE HCL-NACL 20-0.9 MG/250ML-% IV SOLN
INTRAVENOUS | Status: DC | PRN
  Administered 2023-04-14: 25 ug/min via INTRAVENOUS

## 2023-04-14 MED ORDER — FENTANYL CITRATE PF 50 MCG/ML IJ SOSY: 50.0000 ug | PREFILLED_SYRINGE | Freq: Once | INTRAMUSCULAR | Status: DC

## 2023-04-14 MED ORDER — HYDROMORPHONE HCL 1 MG/ML IJ SOLN
0.2500 mg | INTRAMUSCULAR | Status: DC | PRN
  Administered 2023-04-14: 0.5 mg via INTRAVENOUS

## 2023-04-14 MED ORDER — ENOXAPARIN SODIUM 40 MG/0.4ML IJ SOSY
40.0000 mg | PREFILLED_SYRINGE | INTRAMUSCULAR | Status: DC
  Administered 2023-04-14 – 2023-04-20 (×7): 40 mg via SUBCUTANEOUS
  Filled 2023-04-14 (×6): qty 0.4

## 2023-04-14 MED ORDER — DIPHENHYDRAMINE HCL 50 MG/ML IJ SOLN: 12.5000 mg | Freq: Four times a day (QID) | INTRAMUSCULAR | Status: DC | PRN

## 2023-04-14 MED ORDER — LEVOTHYROXINE SODIUM 25 MCG PO TABS
125.0000 ug | ORAL_TABLET | Freq: Every day | ORAL | Status: DC
  Administered 2023-04-15: 125 ug via ORAL
  Filled 2023-04-14: qty 1

## 2023-04-14 MED ORDER — FLUTICASONE PROPIONATE 50 MCG/ACT NA SUSP
1.0000 | Freq: Every day | NASAL | Status: DC
  Filled 2023-04-14: qty 16

## 2023-04-14 MED ORDER — ONDANSETRON HCL 4 MG/2ML IJ SOLN
INTRAMUSCULAR | Status: DC | PRN
  Administered 2023-04-14: 4 mg via INTRAVENOUS

## 2023-04-14 MED ORDER — DIPHENHYDRAMINE HCL 12.5 MG/5ML PO ELIX: 12.5000 mg | ORAL_SOLUTION | Freq: Four times a day (QID) | ORAL | Status: DC | PRN

## 2023-04-14 MED ORDER — SODIUM CHLORIDE (PF) 0.9 % IJ SOLN
INTRAMUSCULAR | Status: AC
  Filled 2023-04-14: qty 10

## 2023-04-14 MED ORDER — METOPROLOL TARTRATE 25 MG PO TABS
25.0000 mg | ORAL_TABLET | Freq: Two times a day (BID) | ORAL | Status: DC | PRN
  Administered 2023-04-15 – 2023-04-16 (×3): 25 mg via ORAL
  Filled 2023-04-14 (×3): qty 1

## 2023-04-14 MED ORDER — ALBUMIN HUMAN 5 % IV SOLN: INTRAVENOUS | Status: DC | PRN

## 2023-04-14 MED ORDER — PHENYLEPHRINE 80 MCG/ML (10ML) SYRINGE FOR IV PUSH (FOR BLOOD PRESSURE SUPPORT)
PREFILLED_SYRINGE | INTRAVENOUS | Status: AC
  Filled 2023-04-14: qty 10

## 2023-04-14 MED ORDER — HYDRALAZINE HCL 20 MG/ML IJ SOLN
INTRAMUSCULAR | Status: AC
  Filled 2023-04-14: qty 1

## 2023-04-14 MED ORDER — DEXAMETHASONE SODIUM PHOSPHATE 10 MG/ML IJ SOLN
INTRAMUSCULAR | Status: DC | PRN
  Administered 2023-04-14: 5 mg via INTRAVENOUS

## 2023-04-14 MED ORDER — HYDROMORPHONE HCL 1 MG/ML IJ SOLN
1.0000 mg | INTRAMUSCULAR | Status: DC | PRN
  Administered 2023-04-14: 1 mg via INTRAVENOUS
  Filled 2023-04-14: qty 1

## 2023-04-14 MED ORDER — IOHEXOL 350 MG/ML SOLN
75.0000 mL | Freq: Once | INTRAVENOUS | Status: AC | PRN
  Administered 2023-04-14: 75 mL via INTRAVENOUS

## 2023-04-14 MED ORDER — FENTANYL CITRATE (PF) 250 MCG/5ML IJ SOLN
INTRAMUSCULAR | Status: DC | PRN
  Administered 2023-04-14 (×2): 50 ug via INTRAVENOUS
  Administered 2023-04-14: 100 ug via INTRAVENOUS

## 2023-04-14 SURGICAL SUPPLY — 51 items
APL PRP STRL LF DISP 70% ISPRP (MISCELLANEOUS) ×1
BLADE CLIPPER SURG (BLADE) IMPLANT
CANISTER SUCT 3000ML PPV (MISCELLANEOUS) ×1 IMPLANT
CELLS DAT CNTRL 66122 CELL SVR (MISCELLANEOUS) ×1 IMPLANT
CHLORAPREP W/TINT 26 (MISCELLANEOUS) ×1 IMPLANT
COVER SURGICAL LIGHT HANDLE (MISCELLANEOUS) ×1 IMPLANT
DRAPE LAPAROSCOPIC ABDOMINAL (DRAPES) ×1 IMPLANT
DRAPE UNIVERSAL (DRAPES) ×1 IMPLANT
DRAPE WARM FLUID 44X44 (DRAPES) ×1 IMPLANT
DRSG OPSITE 4X5.5 SM (GAUZE/BANDAGES/DRESSINGS) IMPLANT
DRSG OPSITE POSTOP 4X10 (GAUZE/BANDAGES/DRESSINGS) IMPLANT
DRSG OPSITE POSTOP 4X8 (GAUZE/BANDAGES/DRESSINGS) IMPLANT
ELECT BLADE 6.5 EXT (BLADE) IMPLANT
ELECT CAUTERY BLADE 6.4 (BLADE) ×1 IMPLANT
ELECT REM PT RETURN 9FT ADLT (ELECTROSURGICAL) ×1
ELECTRODE REM PT RTRN 9FT ADLT (ELECTROSURGICAL) ×1 IMPLANT
GLOVE BIO SURGEON STRL SZ 6.5 (GLOVE) ×1 IMPLANT
GLOVE BIOGEL PI IND STRL 6 (GLOVE) ×1 IMPLANT
GOWN STRL REUS W/ TWL LRG LVL3 (GOWN DISPOSABLE) ×2 IMPLANT
GOWN STRL REUS W/TWL LRG LVL3 (GOWN DISPOSABLE) ×2
HANDLE SUCTION POOLE (INSTRUMENTS) ×1 IMPLANT
KIT BASIN OR (CUSTOM PROCEDURE TRAY) ×1 IMPLANT
KIT TURNOVER KIT B (KITS) ×1 IMPLANT
LIGASURE IMPACT 36 18CM CVD LR (INSTRUMENTS) IMPLANT
NS IRRIG 1000ML POUR BTL (IV SOLUTION) ×2 IMPLANT
PACK GENERAL/GYN (CUSTOM PROCEDURE TRAY) ×1 IMPLANT
PAD ARMBOARD 7.5X6 YLW CONV (MISCELLANEOUS) ×1 IMPLANT
PENCIL SMOKE EVACUATOR (MISCELLANEOUS) ×1 IMPLANT
RELOAD PROXIMATE 75MM BLUE (ENDOMECHANICALS) ×3 IMPLANT
RELOAD STAPLE 75 3.8 BLU REG (ENDOMECHANICALS) IMPLANT
RETRACTOR WND ALEXIS 18 MED (MISCELLANEOUS) IMPLANT
RTRCTR WOUND ALEXIS 18CM MED (MISCELLANEOUS) ×1
SPONGE T-LAP 18X18 ~~LOC~~+RFID (SPONGE) IMPLANT
STAPLER PROXIMATE 75MM BLUE (STAPLE) IMPLANT
STAPLER TA90 4.8 THK SLIMI (STAPLE) IMPLANT
STAPLER VISISTAT 35W (STAPLE) ×1 IMPLANT
SUCTION POOLE HANDLE (INSTRUMENTS) ×1
SUT NOVA 1 T20/GS 25DT (SUTURE) IMPLANT
SUT NOVA NAB GS-21 1 T12 (SUTURE) IMPLANT
SUT PDS AB 1 TP1 54 (SUTURE) IMPLANT
SUT PDS AB 1 TP1 96 (SUTURE) IMPLANT
SUT SILK 2 0 SH CR/8 (SUTURE) ×1 IMPLANT
SUT SILK 2 0 TIES 10X30 (SUTURE) ×1 IMPLANT
SUT SILK 3 0 SH CR/8 (SUTURE) ×1 IMPLANT
SUT SILK 3 0 TIES 10X30 (SUTURE) ×1 IMPLANT
SUT VIC AB 2-0 CT1 27 (SUTURE) ×1
SUT VIC AB 2-0 CT1 TAPERPNT 27 (SUTURE) IMPLANT
SUT VIC AB 3-0 SH 18 (SUTURE) IMPLANT
TOWEL GREEN STERILE (TOWEL DISPOSABLE) ×1 IMPLANT
TRAY FOLEY MTR SLVR 16FR STAT (SET/KITS/TRAYS/PACK) IMPLANT
YANKAUER SUCT BULB TIP NO VENT (SUCTIONS) IMPLANT

## 2023-04-14 NOTE — Anesthesia Preprocedure Evaluation (Signed)
Anesthesia Evaluation    Reviewed: Allergy & Precautions, Patient's Chart, lab work & pertinent test results, Unable to perform ROS - Chart review onlyPreop documentation limited or incomplete due to emergent nature of procedure.  Airway Mallampati: II  TM Distance: >3 FB Neck ROM: Full    Dental  (+) Missing, Poor Dentition   Pulmonary former smoker   breath sounds clear to auscultation       Cardiovascular hypertension, + Valvular Problems/Murmurs  Rhythm:Regular Rate:Normal  Echo 05/2022  1. This is an inconclusive stress echocardiogram for ischemia.   2. This is an indeterminate risk study for ischemia (study done for  inducivle LVOT gradient).   3. At rest, hypderynamie LV with LVEF 70%. Septal thickness 26 mm.   4. There is resting systolic anterior motion of the mitral valve and  associated mitral regurgitation. There is notable contimation of the LVOT  gradient by mitral valve signal   5. Back calculation of gradient using mitral regurgitaiton method shows a  resting gradient of 29 mm Hg but an inducible gradient of 90 mm Hg.   6. 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   7. Trivial pericardial effusion without tamponade.   8. Left Atrial dilation.     - HOCM   Neuro/Psych  Neuromuscular disease    GI/Hepatic negative GI ROS, Neg liver ROS,,,  Endo/Other  negative endocrine ROS    Renal/GU negative Renal ROS     Musculoskeletal negative musculoskeletal ROS (+)    Abdominal   Peds  Hematology negative hematology ROS (+)   Anesthesia Other Findings   Reproductive/Obstetrics                             Anesthesia Physical Anesthesia Plan  ASA: 4 and emergent  Anesthesia Plan: General   Post-op Pain Management:    Induction: Intravenous  PONV Risk Score and Plan: 4 or greater and Ondansetron, Dexamethasone, Midazolam and Scopolamine patch - Pre-op  Airway  Management Planned: Oral ETT  Additional Equipment: Arterial line  Intra-op Plan:   Post-operative Plan: Possible Post-op intubation/ventilation  Informed Consent:   Plan Discussed with:   Anesthesia Plan Comments:        Anesthesia Quick Evaluation

## 2023-04-14 NOTE — Anesthesia Procedure Notes (Signed)
Procedure Name: Intubation Date/Time: 04/14/2023 9:08 AM  Performed by: Nils Pyle, CRNAPre-anesthesia Checklist: Patient identified, Emergency Drugs available, Suction available and Patient being monitored Patient Re-evaluated:Patient Re-evaluated prior to induction Oxygen Delivery Method: Circle System Utilized Preoxygenation: Pre-oxygenation with 100% oxygen Induction Type: IV induction, Rapid sequence and Cricoid Pressure applied Laryngoscope Size: Miller and 2 Grade View: Grade I Tube type: Oral Tube size: 7.5 mm Number of attempts: 1 Airway Equipment and Method: Stylet and Oral airway Placement Confirmation: ETT inserted through vocal cords under direct vision, positive ETCO2 and breath sounds checked- equal and bilateral Secured at: 22 cm Tube secured with: Tape Dental Injury: Teeth and Oropharynx as per pre-operative assessment

## 2023-04-14 NOTE — Anesthesia Procedure Notes (Signed)
Arterial Line Insertion Start/End7/04/2023 9:05 AM, 04/14/2023 9:08 AM Performed by: Samara Deist, CRNA, CRNA  Patient location: OR. Preanesthetic checklist: patient identified, IV checked, site marked, risks and benefits discussed, surgical consent, monitors and equipment checked, pre-op evaluation and anesthesia consent Lidocaine 1% used for infiltration Left, radial was placed Catheter size: 20 G Hand hygiene performed  and maximum sterile barriers used   Attempts: 1 Procedure performed without using ultrasound guided technique. Following insertion, dressing applied and Biopatch. Post procedure assessment: normal and unchanged  Patient tolerated the procedure well with no immediate complications.

## 2023-04-14 NOTE — OR Nursing (Addendum)
Surgical consent not obtained due to pts emergency condition.

## 2023-04-14 NOTE — ED Provider Notes (Signed)
[facility] AT [facility] Provider Note   CSN: [id number] Arrival date & time: 04/14/23  0536     History  Chief Complaint  Patient presents with   Abdominal Pain   Hypertension    Grace Dyer is a 72 y.o. female.  72 yo F with a chief complaints of abdominal pain.  This woke her up about 4 and half hours ago.  Since then she has had very severe abdominal discomfort.  Has been constant radiates into the lower back.  Nothing seems to make it better or worse.  Was found to be very hypertensive with EMS.  Had some improvement with morphine en route.  They also gave her aspirin and nitroglycerin.  She was well yesterday.  Denies any symptoms yesterday.  Was able to eat and drink without issue.  Went out to eat last night.  No diarrhea.   Abdominal Pain Hypertension Associated symptoms include abdominal pain.       Home Medications Prior to Admission medications   Medication Sig Start Date End Date Taking? Authorizing Provider  fluticasone (FLONASE) 50 MCG/ACT nasal spray Place 1 spray into both nostrils daily. Begin by using 2 sprays in each nare daily for 3 to 5 days, then decrease to 1 spray in each nare daily. 12/28/22   Theadora Rama Scales, PA-C  levocetirizine (XYZAL) 5 MG tablet Take 1 tablet (5 mg total) by mouth every evening. 12/28/22 06/26/23  Theadora Rama Scales, PA-C  levothyroxine (SYNTHROID) 125 MCG tablet Take 1 tablet (125 mcg total) by mouth daily. 12/11/22   Shamleffer, Konrad Dolores, MD  metoprolol succinate (TOPROL-XL) 100 MG 24 hr tablet Take 1 tablet (100 mg total) by mouth daily. 02/26/22   Hilty, Lisette Abu, MD  metoprolol tartrate (LOPRESSOR) 25 MG tablet Take 1 tablet (25 mg total) by mouth 2 (two) times daily as needed for up to 30 doses (palpitations). 06/06/22   Curatolo, Adam, DO  Multiple Vitamins-Minerals (ADULT GUMMY PO) Take 2 capsules by mouth daily.    [provider]  NIFEdipine (PROCARDIA XL/NIFEDICAL  XL) 60 MG 24 hr tablet Take 60 mg by mouth 2 (two) times daily. 08/31/22   [provider]  rosuvastatin (CRESTOR) 10 MG tablet Take 10 mg by mouth at bedtime. 10/24/21   [provider]      Allergies    Codeine    Review of Systems   Review of Systems  Gastrointestinal:  Positive for abdominal pain.    Physical Exam Updated Vital Signs BP (!) 203/89   Pulse (!) 51   Temp 97.7 F (36.5 C) (Oral)   Resp 10   Ht 5\' 3"  (1.6 m)   Wt 73.5 kg   SpO2 98%   BMI 28.70 kg/m  Physical Exam Vitals and nursing note reviewed.  Constitutional:      General: She is not in acute distress.    Appearance: She is well-developed. She is not diaphoretic.  HENT:     Head: Normocephalic and atraumatic.  Eyes:     Pupils: Pupils are equal, round, and reactive to light.  Cardiovascular:     Rate and Rhythm: Normal rate and regular rhythm.     Heart sounds: No murmur heard.    No friction rub. No gallop.  Pulmonary:     Effort: Pulmonary effort is normal.     Breath sounds: No wheezing or rales.  Abdominal:     General: There is no distension.     Palpations:  Abdomen is soft.     Tenderness: There is abdominal tenderness.     Comments: Tenderness out of proportion to abdomen.  Musculoskeletal:        General: No tenderness.     Cervical back: Normal range of motion and neck supple.  Skin:    General: Skin is warm and dry.  Neurological:     Mental Status: She is alert and oriented to person, place, and time.  Psychiatric:        Behavior: Behavior normal.     ED Results / Procedures / Treatments   Labs (all labs ordered are listed, but only abnormal results are displayed) Labs Reviewed  CBC WITH DIFFERENTIAL/PLATELET - Abnormal; Notable for the following components:      Result Value   WBC 10.6 (*)    Neutro Abs 8.5 (*)    All other components within normal limits  COMPREHENSIVE METABOLIC PANEL - Abnormal; Notable for the following components:   Glucose, Bld  221 (*)    Total Bilirubin 1.6 (*)    All other components within normal limits  URINALYSIS, ROUTINE W REFLEX MICROSCOPIC - Abnormal; Notable for the following components:   Color, Urine STRAW (*)    Glucose, UA 150 (*)    Ketones, ur 5 (*)    Protein, ur 30 (*)    All other components within normal limits  I-STAT CHEM 8, ED - Abnormal; Notable for the following components:   Glucose, Bld 224 (*)    All other components within normal limits  LIPASE, BLOOD  LACTIC ACID, PLASMA  LACTIC ACID, PLASMA    EKG EKG Interpretation Date/Time:  Sunday April 14 2023 05:50:15 EDT Ventricular Rate:  44 PR Interval:  166 QRS Duration:  111 QT Interval:  530 QTC Calculation: 454 R Axis:   28  Text Interpretation: Sinus bradycardia Probable LVH with secondary repol abnrm deep inverted t waves in v4,5 seen on prior Since last tracing rate slower Otherwise no significant change Confirmed by Melene Plan [phone]) on 04/14/2023 5:53:31 AM  Radiology CT Angio Chest/Abd/Pel for Dissection W and/or Wo Contrast  Result Date: 04/14/2023 CLINICAL DATA:  Aneurysm suspected. EXAM: CT ANGIOGRAPHY CHEST, ABDOMEN AND PELVIS TECHNIQUE: Non-contrast CT of the chest was initially obtained. Multidetector CT imaging through the chest, abdomen and pelvis was performed using the standard protocol during bolus administration of intravenous contrast. Multiplanar reconstructed images and MIPs were obtained and reviewed to evaluate the vascular anatomy. RADIATION DOSE REDUCTION: This exam was performed according to the departmental dose-optimization program which includes automated exposure control, adjustment of the mA and/or kV according to patient size and/or use of iterative reconstruction technique. CONTRAST:  75mL OMNIPAQUE IOHEXOL 350 MG/ML SOLN COMPARISON:  None Available. FINDINGS: CTA CHEST FINDINGS Cardiovascular: No evidence of thoracic aortic aneurysm or dissection. Normal heart size. No pericardial effusion. Aortic  atherosclerosis and coronary artery calcifications. Mediastinum/Nodes: 1 thyroidectomy. Status post thyroidectomy. Tracer appears patent scratch the trachea and esophagus are unremarkable. No enlarged mediastinal or hilar lymph nodes. Lungs/Pleura: No pleural effusion, airspace consolidation, atelectasis or pneumothorax. 6 mm nodule identified in the superior segment of right lower lobe, image 38/7. 5 mm nodule is identified within the posterior right upper lobe, image 48/7. Mild patchy ground-glass attenuation identified within the lung bases, right greater than left. No pleural effusion or interstitial edema. Musculoskeletal: Multilevel thoracic spondylosis identified. No acute or suspicious osseous abnormalities. Review of the MIP images confirms the above findings. CTA ABDOMEN AND PELVIS FINDINGS VASCULAR Aorta: Normal  caliber aorta without aneurysm, dissection, vasculitis or significant stenosis. Aortic atherosclerotic calcifications. Celiac: Patent without evidence of aneurysm, dissection, vasculitis or significant stenosis. SMA: Approximately 30% stenosis at the origin of the SMA secondary to calcified plaque. Renals: Approximately 50% stenosis is identified at the level of the proximal right renal artery secondary to calcified plaque IMA: Patent without evidence of aneurysm, dissection, vasculitis or significant stenosis. Inflow: Patent without evidence of aneurysm, dissection, vasculitis or significant stenosis. Veins: No obvious venous abnormality within the limitations of this arterial phase study. Review of the MIP images confirms the above findings. NON-VASCULAR Hepatobiliary: No focal liver abnormality is seen. No gallstones, gallbladder wall thickening, or biliary dilatation. Pancreas: Unremarkable. No pancreatic ductal dilatation or surrounding inflammatory changes. Spleen: Normal in size without focal abnormality. Adrenals/Urinary Tract: Normal adrenal glands. Cyst arising off the lateral cortex of  the left kidney measures 1 cm, image 149/6. No follow-up imaging recommended. Similarly there is a cyst off the posterior cortex of the left kidney measuring 8 mm, image 163/6. Also, no follow-up imaging recommended. No signs of obstructive uropathy. Significantly diminished exam detail of the bladder due to streak artifact from hip prostheses. Stomach/Bowel: Small hiatal hernia. Stomach nondistended. The appendix is visualized and appears normal. No pathologic dilatation of the large or small bowel loops. Within the right lower quadrant of the abdomen is a cluster of converging small bowel loops with mild wall thickening and central mesenteric venous congestion/fat stranding, image 225/6. There is associated swirling of the central mesenteric vessels to these loops of bowel which suggest either an internal hernia or malrotation. No signs of pneumatosis or high-grade bowel obstruction. Lymphatic: No signs of abdominopelvic adenopathy. Reproductive: Uterus appears surgically absent. No adnexal mass identified. Other: Small volume of free fluid extends along the right pericolic gutter. No focal fluid collections. No signs of pneumoperitoneum. Musculoskeletal: Bilateral hip prostheses. Thoracolumbar spondylosis identified. First degree anterolisthesis L5 on S1. Review of the MIP images confirms the above findings. IMPRESSION: 1. No evidence of thoracic or abdominal aortic aneurysm or dissection. 2. Multiple converging small bowel loops within the right lower quadrant of the abdomen with mild wall thickening and central mesenteric venous congestion/fat stranding. There is associated swirling of the central mesenteric vessels to these loops of bowel which suggest either an internal hernia or malrotation. No signs of pneumatosis or high-grade bowel obstruction. Surgical consultation is recommended. 3. Small volume of free fluid extends along the right pericolic gutter. 4. Two right lung pulmonary nodules. Most severe: 6  mm right solid pulmonary nodule.Per Fleischner Society Guidelines, recommend a non-contrast Chest CT at 3-6 months, then consider another non-contrast Chest CT at 18-24 months. If patient is low risk for malignancy, non-contrast Chest CT at 18-24 months is optional. 5. Aortic Atherosclerosis (ICD10-I70.0). Coronary artery calcifications. Electronically Signed   By: Signa Kell M.D.   On: 04/14/2023 06:45    Procedures .Critical Care  Performed by: Melene Plan, DO Authorized by: Melene Plan, DO   Critical care provider statement:    Critical care time (minutes):  35   Critical care time was exclusive of:  Separately billable procedures and treating other patients   Critical care was time spent personally by me on the following activities:  Development of treatment plan with patient or surrogate, discussions with consultants, evaluation of patient's response to treatment, examination of patient, ordering and review of laboratory studies, ordering and review of radiographic studies, ordering and performing treatments and interventions, pulse oximetry, re-evaluation of patient's condition and review  of old charts   Care discussed with: admitting provider       Medications Ordered in ED Medications  piperacillin-tazobactam (ZOSYN) IVPB 3.375 g (3.375 g Intravenous New Bag/Given 04/14/23 0658)  HYDROmorphone (DILAUDID) injection 1 mg (1 mg Intravenous Given 04/14/23 0551)  ondansetron (ZOFRAN) injection 4 mg (4 mg Intravenous Given 04/14/23 0551)  sodium chloride 0.9 % bolus 1,000 mL (0 mLs Intravenous Stopped 04/14/23 0654)  iohexol (OMNIPAQUE) 350 MG/ML injection 75 mL (75 mLs Intravenous Contrast Given 04/14/23 0603)  HYDROmorphone (DILAUDID) injection 1 mg (1 mg Intravenous Given 04/14/23 0627)  ondansetron (ZOFRAN) injection 4 mg (4 mg Intravenous Given 04/14/23 1610)    ED Course/ Medical Decision Making/ A&P                             Medical Decision Making Amount and/or Complexity of Data  Reviewed Labs: ordered. Radiology: ordered. ECG/medicine tests: ordered.  Risk Prescription drug management.   72 yo F with a cc of abdominal pain. Started about 4.5 hours ago.  Woke her up from sleep.  Severe pain going into the lower back.  HTN, bradycardia.  Labs, CT.  Treat pain and nausea, reassess.  Patient's most recent cardiology visit from back in May reviewed was noted to be significantly bradycardic at that visit heart rate manually counted in the 50s.  She was maintained on metoprolol and there were plans to have her on a Holter monitor.  Lactic is normal.  No significant electrolyte abnormality.  CT with concern for volvulus vs internal hernia.  Plan to consult surgery, started on zosyn.   Patient care signed out to Dr. Dalene Seltzer, please see their note for further details of care in the ED.  The patients results and plan were reviewed and discussed.   Any x-rays performed were independently reviewed by myself.   Differential diagnosis were considered with the presenting HPI.  Medications  piperacillin-tazobactam (ZOSYN) IVPB 3.375 g (3.375 g Intravenous New Bag/Given 04/14/23 0658)  HYDROmorphone (DILAUDID) injection 1 mg (1 mg Intravenous Given 04/14/23 0551)  ondansetron (ZOFRAN) injection 4 mg (4 mg Intravenous Given 04/14/23 0551)  sodium chloride 0.9 % bolus 1,000 mL (0 mLs Intravenous Stopped 04/14/23 0654)  iohexol (OMNIPAQUE) 350 MG/ML injection 75 mL (75 mLs Intravenous Contrast Given 04/14/23 0603)  HYDROmorphone (DILAUDID) injection 1 mg (1 mg Intravenous Given 04/14/23 0627)  ondansetron (ZOFRAN) injection 4 mg (4 mg Intravenous Given 04/14/23 0627)    Vitals:   04/14/23 0615 04/14/23 0630 04/14/23 0645 04/14/23 0700  BP: (!) 231/92 (!) 208/86 (!) 196/84 (!) 203/89  Pulse: 62 (!) 48 (!) 48 (!) 51  Resp: 14 (!) 26 19 10   Temp:      TempSrc:      SpO2: 98% 100% 98% 98%  Weight:      Height:        Final diagnoses:  Generalized abdominal pain  Volvulus (HCC)     Admission/ observation were discussed with the admitting physician, patient and/or family and they are comfortable with the plan.          Final Clinical Impression(s) / ED Diagnoses Final diagnoses:  Generalized abdominal pain  Volvulus [facility])    Rx / DC Orders ED Discharge Orders     None         Melene Plan, DO 04/14/23 0710

## 2023-04-14 NOTE — ED Triage Notes (Signed)
Patient reports woke up at 0100 with abd pain.  Patient was normal since then.  Patient is hypertensive and brady.  Has a hx of afib but is currently in the 40s;  Patient received nitroglycerin, zofran morphine and 324mg  ASA.  Patient has had n./v  Patient continues to moan in pain and tender to abd.

## 2023-04-14 NOTE — Transfer of Care (Signed)
Immediate Anesthesia Transfer of Care Note  Patient: Grace Dyer  Procedure(s) Performed: EXPLORATORY LAPAROTOMY with reduction of internal hernia and small bowel resection  Patient Location: PACU  Anesthesia Type:General  Level of Consciousness: awake and drowsy  Airway & Oxygen Therapy: Patient Spontanous Breathing and Patient connected to nasal cannula oxygen  Post-op Assessment: Report given to RN, Post -op Vital signs reviewed and stable, and Patient moving all extremities X 4  Post vital signs: Reviewed and stable  Last Vitals:  Vitals Value Taken Time  BP 182/104   Temp    Pulse 62 04/14/23 1039  Resp 17 04/14/23 1039  SpO2 95 % 04/14/23 1039  Vitals shown include unvalidated device data.  Last Pain:  Vitals:   04/14/23 0658  TempSrc:   PainSc: 6          Complications: No notable events documented.

## 2023-04-14 NOTE — Anesthesia Postprocedure Evaluation (Signed)
Anesthesia Post Note  Patient: Grace Dyer  Procedure(s) Performed: EXPLORATORY LAPAROTOMY with reduction of internal hernia and small bowel resection     Patient location during evaluation: PACU Anesthesia Type: General Level of consciousness: sedated and patient cooperative Pain management: pain level controlled Vital Signs Assessment: post-procedure vital signs reviewed and stable Respiratory status: spontaneous breathing Cardiovascular status: stable Anesthetic complications: no   No notable events documented.  Last Vitals:  Vitals:   04/14/23 1430 04/14/23 1451  BP: 125/89 131/86  Pulse: 85 88  Resp: 20 18  Temp: 36.6 C 36.7 C  SpO2: 94% 97%    Last Pain:  Vitals:   04/14/23 1547  TempSrc:   PainSc: 6                  Lewie Loron

## 2023-04-14 NOTE — Op Note (Signed)
04/14/2023  10:21 AM  PATIENT:  Grace Dyer  72 y.o. female  Patient Care Team: Ellyn Hack, MD as PCP - General (Family Medicine)  PRE-OPERATIVE DIAGNOSIS:  probable internal hernia  POST-OPERATIVE DIAGNOSIS:  Internal hernia with compromised small bowel  PROCEDURE:  EXPLORATORY LAPAROTOMY, SMALL BOWEL RESECTION   Surgeon(s): Romie Levee, MD  ASSISTANT: none   ANESTHESIA:   general  EBL:  Total I/O In: 1550 [I.V.:1300; IV Piggyback:250] Out: 325 [Urine:75; Blood:250]  DRAINS: none   SPECIMEN:  Source of Specimen:  small bowel, distal jejunum  DISPOSITION OF SPECIMEN:  PATHOLOGY  COUNTS:  YES  PLAN OF CARE: Admit to inpatient   PATIENT DISPOSITION:  PACU - hemodynamically stable.  INDICATION: 72 year old female who presented to the emergency department with acute onset of abdominal pain.  CT scan was performed and showed a probable internal hernia.  She was seen by the surgical team and emergent surgery was recommended.  Risk and benefits of surgery were discussed with patient.  She agreed to proceed with surgery.   OR FINDINGS: Internal hernia due to omental adhesions to the abdominal wall.  Approximately 10cm of small bowel concerning for necrosis.  Approximately 60 cm of closed-loop small bowel that perfused with reduction of hernia.  DESCRIPTION: the patient was identified in the preoperative holding area and taken to the OR where they were laid supine on the operating room table.  General anesthesia was induced without difficulty. SCDs were also noted to be in place prior to the initiation of anesthesia.  The patient was then prepped and draped in the usual sterile fashion.   A surgical timeout was performed indicating the correct patient, procedure, positioning and need for preoperative antibiotics.   I began by making a periumbilical incision using a 10 blade scalpel.  This was carried down through subcutaneous tissue using electrocautery.  Fascia was  entered at midline and the peritoneum was entered bluntly.  The fascial incision was extended superiorly and we initially encountered omental adhesions to the lower midline incision and pelvis.  These were taken down using electrocautery.  A medium Alexis wound protector was placed.  The small bowel was reduced from the internal hernia.  The omental adhesions to the mesentery were taken down to reduce the hernia completely.  The entire small bowel was then eviscerated.  It was inspected from the ligament of Treitz down to the terminal ileum.  There were a few adhesions in the right lower quadrant which were dissected free.  Once this was complete, the bowel was placed back into the abdomen.  I inspected the colon and the upper abdominal structures.  NG tube was manipulated into place.  There were no other acute findings noted within the abdomen.  I turned my attention to the small bowel.  Most of the small bowel that was compromised within the closed-loop was now appearing perfused.  There was approximately 10 cm portion of small bowel in the distal jejunum that was noted to have significant hemorrhage within the wall concerning for possible vascular compromise.  There was a good palpable pulse noted within the mesentery.  I decided to remove this portion of small bowel.  A 75 mm GIA stapler was used to transect the proximal and distal portions.  A LigaSure device was used to divide the mesentery.  Another 75 mm GIA stapler was used to create an anastomosis.  A 90 mm TA stapler was used to close the common enterotomy.  The edges of the  staple lines were imbricated using interrupted 3-0 silk sutures and a 3-0 silk was placed in the crotch of the anastomosis to help with tension in this area.  The anastomosis was patent to approximately 3 fingerbreadths.  The mesenteric defect was closed using interrupted 2-0 silk sutures.  This was then placed back into the abdomen the abdomen was irrigated with approximately 2 L of  warm normal saline.  There was no active hemorrhage noted.  There was minimal contamination from small bowel spillage.  The fascia was then closed using 2, #1 Novafil running sutures.  The subcutaneous tissue was reapproximated with a running 2-0 Vicryl suture and the skin was closed with staples.  A sterile dressing was applied.  The patient was then awakened from anesthesia and sent to the postanesthesia care unit in stable condition.  All counts were correct per operating room staff.  Vanita Panda, MD  Colorectal and General Surgery [facility]

## 2023-04-14 NOTE — Plan of Care (Signed)
  Problem: Health Behavior/Discharge Planning: Goal: Ability to manage health-related needs will improve Outcome: Progressing   

## 2023-04-14 NOTE — H&P (Signed)
Admission Note  Grace Dyer Jan 09, 1951  [id number].    Requesting MD: Alvira Monday, MD Chief Complaint/Reason for Consult: possible internal hernia  HPI:  Patient is a 72 year old female who presented to the ED with abdominal pain that woke her up around 1 AM. Since onset of pain it has become more severe and been constant. Associated nausea with emesis that is non-bloody. Last BM yesterday and was feeling in her usual state of health prior to pain onset. Prior abdominal surgery includes abdominal hysterectomy and surgery for gastric perforation a few years ago. PMH otherwise significant for thyroid cancer s/p thyroidectomy, HTN, hypertrophic cardiomyopathy, HLD. She is not on any blood thinners. Allergy to codeine.   ROS: Negative other than HPI  History reviewed. No pertinent family history.  Past Medical History:  Diagnosis Date   Aortic atherosclerosis (HCC)    Cancer (HCC)    Thyroid   Dyslipidemia    Dyspnea    HCM   Heart murmur    Hypertension    Hypertrophic cardiomyopathy (HCC)    Osteoporosis    Palpitations    Sciatica    Scoliosis     Past Surgical History:  Procedure Laterality Date   ABDOMINAL HYSTERECTOMY  1997   total   HIP ARTHROPLASTY Right 2008   HIP ARTHROPLASTY Left 2013   THYROIDECTOMY N/A 10/15/2022   Procedure: TOTAL THYROIDECTOMY;  Surgeon: Darnell Level, MD;  Location: [facility]S;  Service: General;  Laterality: N/A;   WISDOM TOOTH EXTRACTION     age 46    Social History:  reports that she quit smoking about 3 years ago. Her smoking use included cigarettes. She has a 5.00 pack-year smoking history. She has never been exposed to tobacco smoke. She has never used smokeless tobacco. She reports current alcohol use. She reports that she does not use drugs.  Allergies:  Allergies  Allergen Reactions   Codeine Other (See Comments)    DIZZINESS    (Not in a hospital admission)   Blood pressure (!) 160/89, pulse (!) 50,  temperature 97.7 F (36.5 C), temperature source Oral, resp. rate 15, height 5\' 3"  (1.6 m), weight 73.5 kg, SpO2 92 %. Physical Exam:  General: pleasant, WD,WN female who is laying in bed and appears uncomfortable Heart: regular, rate, and rhythm.  Normal s1,s2. No obvious murmurs, gallops, or rubs noted.  Palpable radial and pedal pulses bilaterally Lungs: CTAB, no wheezes, rhonchi, or rales noted.  Respiratory effort nonlabored Abd: soft, ttp in upper abdomen with peritonitis, mild distention, +BS MS: all 4 extremities are symmetrical with no cyanosis, clubbing, or edema. Skin: warm and dry with no masses, lesions, or rashes Neuro: Cranial nerves 2-12 grossly intact, sensation is normal throughout Psych: A&Ox3 with an appropriate affect.   Results for orders placed or performed during the hospital encounter of 04/14/23 (from the past 48 hour(s))  CBC with Differential     Status: Abnormal   Collection Time: 04/14/23  5:51 AM  Result Value Ref Range   WBC 10.6 (H) 4.0 - 10.5 K/uL   RBC 4.02 3.87 - 5.11 MIL/uL   Hemoglobin 13.2 12.0 - 15.0 g/dL   HCT 40.9 81.1 - 91.4 %   MCV 95.5 80.0 - 100.0 fL   MCH 32.8 26.0 - 34.0 pg   MCHC 34.4 30.0 - 36.0 g/dL   RDW 78.2 95.6 - 21.3 %   Platelets 211 150 - 400 K/uL   nRBC 0.0 0.0 - 0.2 %  Neutrophils Relative % 81 %   Neutro Abs 8.5 (H) 1.7 - 7.7 K/uL   Lymphocytes Relative 11 %   Lymphs Abs 1.2 0.7 - 4.0 K/uL   Monocytes Relative 7 %   Monocytes Absolute 0.8 0.1 - 1.0 K/uL   Eosinophils Relative 1 %   Eosinophils Absolute 0.1 0.0 - 0.5 K/uL   Basophils Relative 0 %   Basophils Absolute 0.0 0.0 - 0.1 K/uL   Immature Granulocytes 0 %   Abs Immature Granulocytes 0.04 0.00 - 0.07 K/uL    Comment: Performed at [facility], [address], [place], [place] [address]  Comprehensive metabolic panel     Status: Abnormal   Collection Time: 04/14/23  5:51 AM  Result Value Ref Range   Sodium 137 135 - 145 mmol/L   Potassium 3.6 3.5 -  5.1 mmol/L   Chloride 104 98 - 111 mmol/L   CO2 23 22 - 32 mmol/L   Glucose, Bld 221 (H) 70 - 99 mg/dL    Comment: Glucose reference range applies only to samples taken after fasting for at least 8 hours.   BUN 20 8 - 23 mg/dL   Creatinine, Ser 2.13 0.44 - 1.00 mg/dL   Calcium 9.6 8.9 - 08.6 mg/dL   Total Protein 7.4 6.5 - 8.1 g/dL   Albumin 3.8 3.5 - 5.0 g/dL   AST 24 15 - 41 U/L   ALT 24 0 - 44 U/L   Alkaline Phosphatase 88 38 - 126 U/L   Total Bilirubin 1.6 (H) 0.3 - 1.2 mg/dL   GFR, Estimated >57 >84 mL/min    Comment: (NOTE) Calculated using the CKD-EPI Creatinine Equation (2021)    Anion gap 10 5 - 15    Comment: Performed at [facility], [address] [place], [place] [address]  Lipase, blood     Status: None   Collection Time: 04/14/23  5:51 AM  Result Value Ref Range   Lipase 30 11 - 51 U/L    Comment: Performed at [facility], [address], [place], [place] [address]  Lactic acid, plasma     Status: None   Collection Time: 04/14/23  5:52 AM  Result Value Ref Range   Lactic Acid, Venous 1.4 0.5 - 1.9 mmol/L    Comment: Performed at [facility], [address], [place], [place] [address]  I-stat chem 8, ED (not at [facility], [facility] or [facility])     Status: Abnormal   Collection Time: 04/14/23  5:58 AM  Result Value Ref Range   Sodium 138 135 - 145 mmol/L   Potassium 3.6 3.5 - 5.1 mmol/L   Chloride 105 98 - 111 mmol/L   BUN 19 8 - 23 mg/dL   Creatinine, Ser 1.02 0.44 - 1.00 mg/dL   Glucose, Bld 725 (H) 70 - 99 mg/dL    Comment: Glucose reference range applies only to samples taken after fasting for at least 8 hours.   Calcium, Ion 1.24 1.15 - 1.40 mmol/L   TCO2 25 22 - 32 mmol/L   Hemoglobin 13.6 12.0 - 15.0 g/dL   HCT 36.6 44.0 - 34.7 %  Urinalysis, Routine w reflex microscopic -Urine, Clean Catch     Status: Abnormal   Collection Time: 04/14/23  6:13 AM  Result Value Ref Range   Color, Urine STRAW (A) YELLOW   APPearance CLEAR CLEAR   Specific  Gravity, Urine 1.018 1.005 - 1.030   pH 7.0 5.0 -  8.0   Glucose, UA 150 (A) NEGATIVE mg/dL   Hgb urine dipstick NEGATIVE NEGATIVE   Bilirubin Urine NEGATIVE NEGATIVE   Ketones, ur 5 (A) NEGATIVE mg/dL   Protein, ur 30 (A) NEGATIVE mg/dL   Nitrite NEGATIVE NEGATIVE   Leukocytes,Ua NEGATIVE NEGATIVE   RBC / HPF 0-5 0 - 5 RBC/hpf   WBC, UA 0-5 0 - 5 WBC/hpf   Bacteria, UA NONE SEEN NONE SEEN   Squamous Epithelial / HPF 0-5 0 - 5 /HPF    Comment: Performed at [facility], [address] [place], [place] [address]   CT Angio Chest/Abd/Pel for Dissection W and/or Wo Contrast  Result Date: 04/14/2023 CLINICAL DATA:  Aneurysm suspected. EXAM: CT ANGIOGRAPHY CHEST, ABDOMEN AND PELVIS TECHNIQUE: Non-contrast CT of the chest was initially obtained. Multidetector CT imaging through the chest, abdomen and pelvis was performed using the standard protocol during bolus administration of intravenous contrast. Multiplanar reconstructed images and MIPs were obtained and reviewed to evaluate the vascular anatomy. RADIATION DOSE REDUCTION: This exam was performed according to the departmental dose-optimization program which includes automated exposure control, adjustment of the mA and/or kV according to patient size and/or use of iterative reconstruction technique. CONTRAST:  75mL OMNIPAQUE IOHEXOL 350 MG/ML SOLN COMPARISON:  None Available. FINDINGS: CTA CHEST FINDINGS Cardiovascular: No evidence of thoracic aortic aneurysm or dissection. Normal heart size. No pericardial effusion. Aortic atherosclerosis and coronary artery calcifications. Mediastinum/Nodes: 1 thyroidectomy. Status post thyroidectomy. Tracer appears patent scratch the trachea and esophagus are unremarkable. No enlarged mediastinal or hilar lymph nodes. Lungs/Pleura: No pleural effusion, airspace consolidation, atelectasis or pneumothorax. 6 mm nodule identified in the superior segment of right lower lobe, image 38/7. 5 mm nodule is identified  within the posterior right upper lobe, image 48/7. Mild patchy ground-glass attenuation identified within the lung bases, right greater than left. No pleural effusion or interstitial edema. Musculoskeletal: Multilevel thoracic spondylosis identified. No acute or suspicious osseous abnormalities. Review of the MIP images confirms the above findings. CTA ABDOMEN AND PELVIS FINDINGS VASCULAR Aorta: Normal caliber aorta without aneurysm, dissection, vasculitis or significant stenosis. Aortic atherosclerotic calcifications. Celiac: Patent without evidence of aneurysm, dissection, vasculitis or significant stenosis. SMA: Approximately 30% stenosis at the origin of the SMA secondary to calcified plaque. Renals: Approximately 50% stenosis is identified at the level of the proximal right renal artery secondary to calcified plaque IMA: Patent without evidence of aneurysm, dissection, vasculitis or significant stenosis. Inflow: Patent without evidence of aneurysm, dissection, vasculitis or significant stenosis. Veins: No obvious venous abnormality within the limitations of this arterial phase study. Review of the MIP images confirms the above findings. NON-VASCULAR Hepatobiliary: No focal liver abnormality is seen. No gallstones, gallbladder wall thickening, or biliary dilatation. Pancreas: Unremarkable. No pancreatic ductal dilatation or surrounding inflammatory changes. Spleen: Normal in size without focal abnormality. Adrenals/Urinary Tract: Normal adrenal glands. Cyst arising off the lateral cortex of the left kidney measures 1 cm, image 149/6. No follow-up imaging recommended. Similarly there is a cyst off the posterior cortex of the left kidney measuring 8 mm, image 163/6. Also, no follow-up imaging recommended. No signs of obstructive uropathy. Significantly diminished exam detail of the bladder due to streak artifact from hip prostheses. Stomach/Bowel: Small hiatal hernia. Stomach nondistended. The appendix is  visualized and appears normal. No pathologic dilatation of the large or small bowel loops. Within the right lower quadrant of the abdomen is a cluster of converging small bowel loops with mild wall thickening and central mesenteric venous congestion/fat  stranding, image 225/6. There is associated swirling of the central mesenteric vessels to these loops of bowel which suggest either an internal hernia or malrotation. No signs of pneumatosis or high-grade bowel obstruction. Lymphatic: No signs of abdominopelvic adenopathy. Reproductive: Uterus appears surgically absent. No adnexal mass identified. Other: Small volume of free fluid extends along the right pericolic gutter. No focal fluid collections. No signs of pneumoperitoneum. Musculoskeletal: Bilateral hip prostheses. Thoracolumbar spondylosis identified. First degree anterolisthesis L5 on S1. Review of the MIP images confirms the above findings. IMPRESSION: 1. No evidence of thoracic or abdominal aortic aneurysm or dissection. 2. Multiple converging small bowel loops within the right lower quadrant of the abdomen with mild wall thickening and central mesenteric venous congestion/fat stranding. There is associated swirling of the central mesenteric vessels to these loops of bowel which suggest either an internal hernia or malrotation. No signs of pneumatosis or high-grade bowel obstruction. Surgical consultation is recommended. 3. Small volume of free fluid extends along the right pericolic gutter. 4. Two right lung pulmonary nodules. Most severe: 6 mm right solid pulmonary nodule.Per Fleischner Society Guidelines, recommend a non-contrast Chest CT at 3-6 months, then consider another non-contrast Chest CT at 18-24 months. If patient is low risk for malignancy, non-contrast Chest CT at 18-24 months is optional. 5. Aortic Atherosclerosis (ICD10-I70.0). Coronary artery calcifications. Electronically Signed   By: Signa Kell M.D.   On: 04/14/2023 06:45       Assessment/Plan Abdominal pain Probable internal hernia  - Hx of abdominal hysterectomy and ex-lap for gastric perforation  - CT with mesenteric swirling and significant abdominal ttp on exam - normal lactate - to OR urgently for exploratory laparotomy, possible bowel resection  - admission post-operatively, level of care TBD - I updated her daughter, Grace Dyer, by phone   I reviewed ED provider notes, last 24 h vitals and pain scores, last 48 h intake and output, last 24 h labs and trends, and last 24 h imaging results.  Juliet Rude, [facility] Surgery 04/14/2023, 8:03 AM Please see Amion for pager number during day hours 7:00am-4:30pm

## 2023-04-15 ENCOUNTER — Encounter (HOSPITAL_COMMUNITY): Payer: Self-pay | Admitting: General Surgery

## 2023-04-15 LAB — CBC
HCT: 36.6 % (ref 36.0–46.0)
Hemoglobin: 12.4 g/dL (ref 12.0–15.0)
MCH: 31.6 pg (ref 26.0–34.0)
MCHC: 33.9 g/dL (ref 30.0–36.0)
MCV: 93.4 fL (ref 80.0–100.0)
Platelets: 197 10*3/uL (ref 150–400)
RBC: 3.92 MIL/uL (ref 3.87–5.11)
RDW: 14.2 % (ref 11.5–15.5)
WBC: 9.6 10*3/uL (ref 4.0–10.5)
nRBC: 0 % (ref 0.0–0.2)

## 2023-04-15 LAB — BASIC METABOLIC PANEL
Anion gap: 8 (ref 5–15)
BUN: 21 mg/dL (ref 8–23)
CO2: 21 mmol/L — ABNORMAL LOW (ref 22–32)
Calcium: 8.8 mg/dL — ABNORMAL LOW (ref 8.9–10.3)
Chloride: 105 mmol/L (ref 98–111)
Creatinine, Ser: 1.06 mg/dL — ABNORMAL HIGH (ref 0.44–1.00)
GFR, Estimated: 56 mL/min — ABNORMAL LOW (ref >60)
Glucose, Bld: 149 mg/dL — ABNORMAL HIGH (ref 70–99)
Potassium: 4.3 mmol/L (ref 3.5–5.1)
Sodium: 134 mmol/L — ABNORMAL LOW (ref 135–145)

## 2023-04-15 MED ORDER — LEVOTHYROXINE SODIUM 100 MCG/5ML IV SOLN: 75.0000 ug | Freq: Every day | INTRAVENOUS | Status: DC

## 2023-04-15 MED ORDER — METHOCARBAMOL 500 MG PO TABS: 500.0000 mg | ORAL_TABLET | Freq: Three times a day (TID) | ORAL | Status: DC

## 2023-04-15 MED ORDER — METHOCARBAMOL 1000 MG/10ML IJ SOLN
500.0000 mg | Freq: Three times a day (TID) | INTRAVENOUS | Status: DC
  Administered 2023-04-15 – 2023-04-19 (×13): 500 mg via INTRAVENOUS
  Filled 2023-04-15 (×4): qty 500
  Filled 2023-04-15 (×2): qty 5
  Filled 2023-04-15: qty 500
  Filled 2023-04-15: qty 5
  Filled 2023-04-15 (×3): qty 500
  Filled 2023-04-15 (×3): qty 5
  Filled 2023-04-15 (×2): qty 500
  Filled 2023-04-15 (×2): qty 5

## 2023-04-15 MED ORDER — ACETAMINOPHEN 10 MG/ML IV SOLN
1000.0000 mg | Freq: Four times a day (QID) | INTRAVENOUS | Status: AC
  Administered 2023-04-15 – 2023-04-16 (×4): 1000 mg via INTRAVENOUS
  Filled 2023-04-15 (×4): qty 100

## 2023-04-15 NOTE — Progress Notes (Signed)
1 Day Post-Op  Subjective: Patient with some pain but controlled.  Foley out and voiding well.  Patient gets around with a cane at home. No flatus   Objective: Vital signs in last 24 hours: Temp:  [97.8 F (36.6 C)-98.7 F (37.1 C)] 98.7 F (37.1 C) (07/08 0828) Pulse Rate:  [69-90] 86 (07/08 0828) Resp:  [14-22] 16 (07/08 0828) BP: (99-143)/(69-89) 99/81 (07/08 0828) SpO2:  [94 %-100 %] 99 % (07/08 0828) Arterial Line BP: (146-159)/(78-84) 146/81 (07/07 1145) Last BM Date : 04/14/23  Intake/Output from previous day: 07/07 0701 - 07/08 0700 In: 3100.1 [P.O.:50; I.V.:2800.1; IV Piggyback:250] Out: 1375 [Urine:1125; Blood:250] Intake/Output this shift: No intake/output data recorded.  PE: Gen: NAD Abd: soft, appropriately tender, NGT with some bilious output at least 600cc not documented.  Midline incision is c/d/I with staples and honeycomb dressing  Lab Results:  Recent Labs    04/14/23 0551 04/14/23 0558 04/14/23 0915 04/15/23 0336  WBC 10.6*  --   --  9.6  HGB 13.2   < > 14.3 12.4  HCT 38.4   < > 42.0 36.6  PLT 211  --   --  197   < > = values in this interval not displayed.   BMET Recent Labs    04/14/23 0551 04/14/23 0558 04/14/23 0915 04/15/23 0336  NA 137 138 136 134*  K 3.6 3.6 3.8 4.3  CL 104 105  --  105  CO2 23  --   --  21*  GLUCOSE 221* 224*  --  149*  BUN 20 19  --  21  CREATININE 0.82 0.80  --  1.06*  CALCIUM 9.6  --   --  8.8*   PT/INR No results for input(s): "LABPROT", "INR" in the last 72 hours. CMP     Component Value Date/Time   NA 134 (L) 04/15/2023 0336   K 4.3 04/15/2023 0336   CL 105 04/15/2023 0336   CO2 21 (L) 04/15/2023 0336   GLUCOSE 149 (H) 04/15/2023 0336   BUN 21 04/15/2023 0336   CREATININE 1.06 (H) 04/15/2023 0336   CALCIUM 8.8 (L) 04/15/2023 0336   PROT 7.4 04/14/2023 0551   ALBUMIN 3.8 04/14/2023 0551   AST 24 04/14/2023 0551   ALT 24 04/14/2023 0551   ALKPHOS 88 04/14/2023 0551   BILITOT 1.6 (H)  04/14/2023 0551   GFRNONAA 56 (L) 04/15/2023 0336   Lipase     Component Value Date/Time   LIPASE 30 04/14/2023 0551       Studies/Results: CT Angio Chest/Abd/Pel for Dissection W and/or Wo Contrast  Result Date: 04/14/2023 CLINICAL DATA:  Aneurysm suspected. EXAM: CT ANGIOGRAPHY CHEST, ABDOMEN AND PELVIS TECHNIQUE: Non-contrast CT of the chest was initially obtained. Multidetector CT imaging through the chest, abdomen and pelvis was performed using the standard protocol during bolus administration of intravenous contrast. Multiplanar reconstructed images and MIPs were obtained and reviewed to evaluate the vascular anatomy. RADIATION DOSE REDUCTION: This exam was performed according to the departmental dose-optimization program which includes automated exposure control, adjustment of the mA and/or kV according to patient size and/or use of iterative reconstruction technique. CONTRAST:  75mL OMNIPAQUE IOHEXOL 350 MG/ML SOLN COMPARISON:  None Available. FINDINGS: CTA CHEST FINDINGS Cardiovascular: No evidence of thoracic aortic aneurysm or dissection. Normal heart size. No pericardial effusion. Aortic atherosclerosis and coronary artery calcifications. Mediastinum/Nodes: 1 thyroidectomy. Status post thyroidectomy. Tracer appears patent scratch the trachea and esophagus are unremarkable. No enlarged mediastinal or hilar lymph nodes.  Lungs/Pleura: No pleural effusion, airspace consolidation, atelectasis or pneumothorax. 6 mm nodule identified in the superior segment of right lower lobe, image 38/7. 5 mm nodule is identified within the posterior right upper lobe, image 48/7. Mild patchy ground-glass attenuation identified within the lung bases, right greater than left. No pleural effusion or interstitial edema. Musculoskeletal: Multilevel thoracic spondylosis identified. No acute or suspicious osseous abnormalities. Review of the MIP images confirms the above findings. CTA ABDOMEN AND PELVIS FINDINGS  VASCULAR Aorta: Normal caliber aorta without aneurysm, dissection, vasculitis or significant stenosis. Aortic atherosclerotic calcifications. Celiac: Patent without evidence of aneurysm, dissection, vasculitis or significant stenosis. SMA: Approximately 30% stenosis at the origin of the SMA secondary to calcified plaque. Renals: Approximately 50% stenosis is identified at the level of the proximal right renal artery secondary to calcified plaque IMA: Patent without evidence of aneurysm, dissection, vasculitis or significant stenosis. Inflow: Patent without evidence of aneurysm, dissection, vasculitis or significant stenosis. Veins: No obvious venous abnormality within the limitations of this arterial phase study. Review of the MIP images confirms the above findings. NON-VASCULAR Hepatobiliary: No focal liver abnormality is seen. No gallstones, gallbladder wall thickening, or biliary dilatation. Pancreas: Unremarkable. No pancreatic ductal dilatation or surrounding inflammatory changes. Spleen: Normal in size without focal abnormality. Adrenals/Urinary Tract: Normal adrenal glands. Cyst arising off the lateral cortex of the left kidney measures 1 cm, image 149/6. No follow-up imaging recommended. Similarly there is a cyst off the posterior cortex of the left kidney measuring 8 mm, image 163/6. Also, no follow-up imaging recommended. No signs of obstructive uropathy. Significantly diminished exam detail of the bladder due to streak artifact from hip prostheses. Stomach/Bowel: Small hiatal hernia. Stomach nondistended. The appendix is visualized and appears normal. No pathologic dilatation of the large or small bowel loops. Within the right lower quadrant of the abdomen is a cluster of converging small bowel loops with mild wall thickening and central mesenteric venous congestion/fat stranding, image 225/6. There is associated swirling of the central mesenteric vessels to these loops of bowel which suggest either an  internal hernia or malrotation. No signs of pneumatosis or high-grade bowel obstruction. Lymphatic: No signs of abdominopelvic adenopathy. Reproductive: Uterus appears surgically absent. No adnexal mass identified. Other: Small volume of free fluid extends along the right pericolic gutter. No focal fluid collections. No signs of pneumoperitoneum. Musculoskeletal: Bilateral hip prostheses. Thoracolumbar spondylosis identified. First degree anterolisthesis L5 on S1. Review of the MIP images confirms the above findings. IMPRESSION: 1. No evidence of thoracic or abdominal aortic aneurysm or dissection. 2. Multiple converging small bowel loops within the right lower quadrant of the abdomen with mild wall thickening and central mesenteric venous congestion/fat stranding. There is associated swirling of the central mesenteric vessels to these loops of bowel which suggest either an internal hernia or malrotation. No signs of pneumatosis or high-grade bowel obstruction. Surgical consultation is recommended. 3. Small volume of free fluid extends along the right pericolic gutter. 4. Two right lung pulmonary nodules. Most severe: 6 mm right solid pulmonary nodule.Per Fleischner Society Guidelines, recommend a non-contrast Chest CT at 3-6 months, then consider another non-contrast Chest CT at 18-24 months. If patient is low risk for malignancy, non-contrast Chest CT at 18-24 months is optional. 5. Aortic Atherosclerosis (ICD10-I70.0). Coronary artery calcifications. Electronically Signed   By: Signa Kell M.D.   On: 04/14/2023 06:45    Anti-infectives: Anti-infectives (From admission, onward)    Start     Dose/Rate Route Frequency Ordered Stop   04/14/23 [phone]  ceFAZolin (ANCEF) IVPB 2g/100 mL premix        2 g 200 mL/hr over 30 Minutes Intravenous  Once 04/14/23 0810 04/14/23 0911   04/14/23 0700  piperacillin-tazobactam (ZOSYN) IVPB 3.375 g        3.375 g 100 mL/hr over 30 Minutes Intravenous  Once 04/14/23 0651  04/14/23 0834        Assessment/Plan POD 1, s/p ex lap with SBR for internal hernia, Dr. Maisie Fus 04/14/23  -await bowel function -cont NGT -mobilize, pulm toilet -multi-modal pain control -PT ordered.  Patient lives at home with daughter.  Uses a cane  FEN - NPO, NGT, IVFs VTE - Lovenox ID - none needed  H/O thyroid cancer s/p resection - synthroid resumed HTN - home meds resumed    LOS: 1 day    Letha Cape , [facility] 04/15/2023, 11:13 AM Please see Amion for pager number during day hours 7:00am-4:30pm or 7:00am -11:30am on weekends

## 2023-04-15 NOTE — Progress Notes (Signed)
Mobility Specialist Progress Note   04/15/23 1205  Mobility  Activity Transferred from bed to chair  Level of Assistance Standby assist, set-up cues, supervision of patient - no hands on  Assistive Device None;Other (Comment) (HHA)  Range of Motion/Exercises Active;All extremities  Activity Response Tolerated well   Patient received in supine and agreeable to participate. Deferred ambulation secondary dizziness, opted to sitting in recliner chair. Completed bed mobility independently with increased time. Stood and transferred to recliner with supervision. Tolerated without incident. Was left in with all needs met, call bell in reach.   Grace Dyer, BS EXP Mobility Specialist Please contact via SecureChat or Rehab office at [phone]

## 2023-04-16 LAB — CBC
HCT: 34.4 % — ABNORMAL LOW (ref 36.0–46.0)
Hemoglobin: 11.3 g/dL — ABNORMAL LOW (ref 12.0–15.0)
MCH: 31.8 pg (ref 26.0–34.0)
MCHC: 32.8 g/dL (ref 30.0–36.0)
MCV: 96.9 fL (ref 80.0–100.0)
Platelets: 148 10*3/uL — ABNORMAL LOW (ref 150–400)
RBC: 3.55 MIL/uL — ABNORMAL LOW (ref 3.87–5.11)
RDW: 14.4 % (ref 11.5–15.5)
WBC: 9 10*3/uL (ref 4.0–10.5)
nRBC: 0 % (ref 0.0–0.2)

## 2023-04-16 LAB — BASIC METABOLIC PANEL
Anion gap: 10 (ref 5–15)
BUN: 16 mg/dL (ref 8–23)
CO2: 26 mmol/L (ref 22–32)
Calcium: 9.5 mg/dL (ref 8.9–10.3)
Chloride: 101 mmol/L (ref 98–111)
Creatinine, Ser: 0.93 mg/dL (ref 0.44–1.00)
GFR, Estimated: >60 mL/min (ref >60)
Glucose, Bld: 120 mg/dL — ABNORMAL HIGH (ref 70–99)
Potassium: 3.9 mmol/L (ref 3.5–5.1)
Sodium: 137 mmol/L (ref 135–145)

## 2023-04-16 NOTE — TOC Initial Note (Signed)
Transition of Care (TOC) - Initial/Assessment Note   Spoke to patient at bedside. Confirmed information.   Patient from home with daughter. Daughter is a CNA and can assist.   PT recommending HHPT and walker and 3 in 1 . Patient in agreement.   Referral for [facility]PT [facility] to [facility] with [facility] .   Ordered walker and 3 in 1 , will need to call closer to discharge for delivery to room prior to discharge  Patient Details  Name: Grace Dyer MRN: [id number] Date of Birth: Sep 08, 1951  Transition of Care [facility]) CM/SW Contact:    Kingsley Plan, RN Phone Number: 04/16/2023, 2:51 PM  Clinical Narrative:                   Expected Discharge Plan: Home w Home Health Services Barriers to Discharge: Continued Medical Work up   Patient Goals and CMS Choice Patient states their goals for this hospitalization and ongoing recovery are:: to return to home CMS Medicare.gov Compare Post Acute Care list provided to:: Patient Choice offered to / list presented to : Patient [place] ownership interest in [facility].provided to:: Patient    Expected Discharge Plan and Services   Discharge Planning Services: CM Consult Post Acute Care Choice: Durable Medical Equipment, Home Health                   DME Arranged: 3-N-1, Walker rolling         HH Arranged: PT HH Agency: [facility] Date [facility] Agency Contacted: 04/16/23 Time HH Agency Contacted: 1451 Representative spoke with at [facility] Agency: Kandee Keen  Prior Living Arrangements/Services   Lives with:: Adult Children Patient language and need for interpreter reviewed:: Yes Do you feel safe going back to the place where you live?: Yes      Need for Family Participation in Patient Care: Yes (Comment) Care giver support system in place?: Yes (comment)   Criminal Activity/Legal Involvement Pertinent to Current Situation/Hospitalization: No - Comment as needed  Activities of Daily Living      Permission Sought/Granted    Permission granted to share information with : No              Emotional Assessment Appearance:: Appears stated age Attitude/Demeanor/Rapport: Engaged Affect (typically observed): Accepting Orientation: : Oriented to Self, Oriented to Place, Oriented to  Time, Oriented to Situation Alcohol / Substance Use: Not Applicable Psych Involvement: No (comment)  Admission diagnosis:  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] Generalized abdominal pain [R10.84] Internal hernia [K45.8] Patient Active Problem List   Diagnosis Date Noted   Internal hernia 04/14/2023   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   Palpitations    PVC (premature ventricular contraction)    Paroxysmal SVT (supraventricular tachycardia)    Hypertrophic cardiomyopathy (HCC) 04/30/2022   Hyperparathyroidism (HCC) 04/25/2022   Vitamin D deficiency 04/25/2022   Multiple thyroid nodules 04/25/2022   PCP:  Ellyn Hack, MD Pharmacy:   [facility] [phone] - [place], [place] - [address] AT [facility] [address] [place] [address] Phone: [phone] Fax: [phone]     Social Determinants of Health (SDOH) Social History: SDOH Screenings   Food Insecurity: No Food Insecurity (10/15/2022)  Housing: Low Risk  (10/15/2022)  Transportation Needs: No Transportation Needs (10/15/2022)  Utilities: Not At Risk (10/15/2022)  Tobacco Use: Medium Risk (04/15/2023)   SDOH Interventions:     Readmission Risk Interventions     No data  to display

## 2023-04-16 NOTE — Progress Notes (Signed)
RN went into the room to help pt off the [facility] and the patient stated that she felt like her stomach was fluttering. RN paged Dr. Janee Morn and notified him he stated that it was the NG tube going off and on that she was feeling. RN relayed that to the patient

## 2023-04-16 NOTE — Plan of Care (Signed)
  Problem: Education: Goal: Knowledge of General Education information will improve Description Including pain rating scale, medication(s)/side effects and non-pharmacologic comfort measures Outcome: Progressing   Problem: Health Behavior/Discharge Planning: Goal: Ability to manage health-related needs will improve Outcome: Progressing   

## 2023-04-16 NOTE — Progress Notes (Signed)
Physical Therapy Treatment Note  (Full Eval note to follow)  Noted pt with dizziness with Mobility Specialist yesterday, and opted to get orthostatic BPs:    04/16/23 1010 04/16/23 1012  Vital Signs  Patient Position (if appropriate) Orthostatic Vitals  --   Orthostatic Lying   BP- Lying 99/68  --   Pulse- Lying 98  --   Orthostatic Sitting  BP- Sitting 103/70 108/69 (in recliner, feet down; immediately post standing trial)  Pulse- Sitting 108 121  Orthostatic Standing at 0 minutes  BP- Standing at 0 minutes 90/79  --   Pulse- Standing at 0 minutes 127 (symptomatic for feeling "heart palpitations")  --   Orthostatic Standing at 3 minutes  BP- Standing at 3 minutes  (Unable to stand long enough for 3 minute standing)  --   Pulse- Standing at 3 minutes  (incr feeling of "heart palpitations")  --     HR seems to be most prominent symptom/indicator of orthostasis;  Came back down to 102 after 2-3 minutes seated;   Will follow;   Van Clines, PT  Acute Rehabilitation Services Office [phone] Secure Chat welcomed

## 2023-04-16 NOTE — Evaluation (Signed)
Physical Therapy Evaluation Patient Details Name: Grace Dyer MRN: [id number] DOB: 1951/03/02 Today's Date: 04/16/2023  History of Present Illness  Patient is a 72 year old female who presented to the ED with abdominal pain; s/p exploratory lap, Small bowel resection; Prior abdominal surgery includes abdominal hysterectomy and surgery for gastric perforation a few years ago. PMH otherwise significant for thyroid cancer s/p thyroidectomy, HTN, hypertrophic cardiomyopathy, HLD,  Bil THAs (2008, 2013)  Clinical Impression   Pt admitted with above diagnosis. Lives at home with family, must negotiate a flight of steps; Prior to admission, pt was able to manage independently; Presents to PT with generalized weakness, decr activity tolerance, decr standing tolerance; Overall needing min assist for bed mobility, sit<>stand, and to take a few steps with RW; Standing toerance limited by significant HR incr, and pt's decr standing toelrance;  Pt currently with functional limitations due to the deficits listed below (see PT Problem List). Pt will benefit from skilled PT to increase their independence and safety with mobility to allow discharge to the venue listed below.           Assistance Recommended at Discharge PRN; PT and family asking about possibility of getting a Home Health Aide  If plan is discharge home, recommend the following:  Can travel by private vehicle  Help with stairs or ramp for entrance;A little help with bathing/dressing/bathroom        Equipment Recommendations Rolling walker (2 wheels);BSC/3in1 (May already have a RW)  Recommendations for Other Services  Other (comment) (Mobility)    Functional Status Assessment Patient has had a recent decline in their functional status and demonstrates the ability to make significant improvements in function in a reasonable and predictable amount of time.     Precautions / Restrictions Precautions Precautions: Fall (NG  Tube) Precaution Comments: Watch HR and dizziness with activity Restrictions Weight Bearing Restrictions: No      Mobility  Bed Mobility Overal bed mobility: Needs Assistance Bed Mobility: Rolling, Sidelying to Sit Rolling: Min guard Sidelying to sit: Min guard       General bed mobility comments: Used bedrails; slow moving, but managing well    Transfers Overall transfer level: Needs assistance Equipment used: Rolling walker (2 wheels) Transfers: Sit to/from Stand, Bed to chair/wheelchair/BSC Sit to Stand: Min assist   Step pivot transfers: Min assist       General transfer comment: Min assist and close guard for safety; cues to self-monitor for activity tolerance    Ambulation/Gait               General Gait Details: Deferred due to pt need to sit down; significant HR increase with standing activity  Stairs            Wheelchair Mobility     Tilt Bed    Modified Rankin (Stroke Patients Only)       Balance Overall balance assessment: Mild deficits observed, not formally tested                                           Pertinent Vitals/Pain Pain Assessment Pain Assessment: Faces Faces Pain Scale: Hurts little more Pain Location: Abdominal discomfort Pain Descriptors / Indicators: Grimacing Pain Intervention(s): Monitored during session    Home Living Family/patient expects to be discharged to:: Private residence Living Arrangements: Children Available Help at Discharge: Family;Available 24 hours/day (at or near) Type  of Home: Apartment Home Access: Stairs to enter Entrance Stairs-Rails: Right Entrance Stairs-Number of Steps: 12   Home Layout: Other (Comment) (Not entirely clear -- may be 12 steps to enter apt, may be two level and 12 steps to get to bathroom) Home Equipment: Rolling Walker (2 wheels);Cane - quad      Prior Function Prior Level of Function : Independent/Modified Independent                      Hand Dominance        Extremity/Trunk Assessment   Upper Extremity Assessment Upper Extremity Assessment: Generalized weakness    Lower Extremity Assessment Lower Extremity Assessment: Generalized weakness    Cervical / Trunk Assessment Cervical / Trunk Assessment: Other exceptions (abdominal surgery)  Communication   Communication: No difficulties  Cognition Arousal/Alertness: Awake/alert Behavior During Therapy: WFL for tasks assessed/performed Overall Cognitive Status: Within Functional Limits for tasks assessed                                          General Comments General comments (skin integrity, edema, etc.): See also other PT note of this date    Exercises     Assessment/Plan    PT Assessment Patient needs continued PT services  PT Problem List Decreased strength;Decreased activity tolerance;Decreased balance;Decreased mobility;Decreased coordination;Decreased knowledge of use of DME;Decreased safety awareness;Decreased knowledge of precautions       PT Treatment Interventions DME instruction;Gait training;Stair training;Functional mobility training;Therapeutic activities;Therapeutic exercise;Balance training;Patient/family education    PT Goals (Current goals can be found in the Care Plan section)  Acute Rehab PT Goals Patient Stated Goal: Hopes to recover well PT Goal Formulation: With patient Time For Goal Achievement: 04/30/23 Potential to Achieve Goals: Good    Frequency Min 3X/week     Co-evaluation               AM-PAC PT "6 Clicks" Mobility  Outcome Measure Help needed turning from your back to your side while in a flat bed without using bedrails?: None Help needed moving from lying on your back to sitting on the side of a flat bed without using bedrails?: A Little Help needed moving to and from a bed to a chair (including a wheelchair)?: A Little Help needed standing up from a chair using your arms (e.g.,  wheelchair or bedside chair)?: A Little Help needed to walk in hospital room?: A Lot Help needed climbing 3-5 steps with a railing? : A Lot 6 Click Score: 17    End of Session Equipment Utilized During Treatment: Gait belt Activity Tolerance: Patient tolerated treatment well;Other (comment) (except noted significant HR increase in standing, coupled with pt request to sit) Patient left: in chair;with call bell/phone within reach;with chair alarm set Nurse Communication: Mobility status PT Visit Diagnosis: Unsteadiness on feet (R26.81);Other abnormalities of gait and mobility (R26.89);Muscle weakness (generalized) (M62.81)    Time: 1610-9604 PT Time Calculation (min) (ACUTE ONLY): 33 min   Charges:   PT Evaluation $PT Eval Moderate Complexity: 1 Mod PT Treatments $Therapeutic Activity: 8-22 mins PT General Charges $$ ACUTE PT VISIT: 1 Visit         Van Clines, PT  Acute Rehabilitation Services Office [phone] Secure Chat welcomed   Levi Aland 04/16/2023, 2:18 PM

## 2023-04-16 NOTE — TOC CM/SW Note (Signed)
Patient confined to one room without a bathroom , and cannot ambulate to bathroom in a timely manner , therefore needs a bedside commode

## 2023-04-16 NOTE — Progress Notes (Signed)
    2 Days Post-Op  Subjective: Patient with some pain but controlled.  Up in chair.  No bowel function.   Objective: Vital signs in last 24 hours: Temp:  [97.8 F (36.6 C)-99.5 F (37.5 C)] 98.9 F (37.2 C) (07/09 0752) Pulse Rate:  [86-107] 102 (07/09 1015) Resp:  [15-18] 17 (07/09 0752) BP: (97-120)/(64-70) 108/69 (07/09 1015) SpO2:  [93 %-99 %] 95 % (07/09 1015) Last BM Date : 04/14/23  Intake/Output from previous day: 07/08 0701 - 07/09 0700 In: 925.6 [I.V.:770.6; IV Piggyback:155] Out: 1600 [Emesis/NG output:1600] Intake/Output this shift: No intake/output data recorded.  PE: Gen: NAD Abd: soft, appropriately tender, NGT with 1600cc of bilious output. Midline incision is c/d/I with staples and honeycomb dressing  Lab Results:  Recent Labs    04/15/23 0336 04/15/23 2358  WBC 9.6 9.0  HGB 12.4 11.3*  HCT 36.6 34.4*  PLT 197 148*   BMET Recent Labs    04/15/23 0336 04/15/23 2358  NA 134* 137  K 4.3 3.9  CL 105 101  CO2 21* 26  GLUCOSE 149* 120*  BUN 21 16  CREATININE 1.06* 0.93  CALCIUM 8.8* 9.5   PT/INR No results for input(s): "LABPROT", "INR" in the last 72 hours. CMP     Component Value Date/Time   NA 137 04/15/2023 2358   K 3.9 04/15/2023 2358   CL 101 04/15/2023 2358   CO2 26 04/15/2023 2358   GLUCOSE 120 (H) 04/15/2023 2358   BUN 16 04/15/2023 2358   CREATININE 0.93 04/15/2023 2358   CALCIUM 9.5 04/15/2023 2358   PROT 7.4 04/14/2023 0551   ALBUMIN 3.8 04/14/2023 0551   AST 24 04/14/2023 0551   ALT 24 04/14/2023 0551   ALKPHOS 88 04/14/2023 0551   BILITOT 1.6 (H) 04/14/2023 0551   GFRNONAA >60 04/15/2023 2358   Lipase     Component Value Date/Time   LIPASE 30 04/14/2023 0551       Studies/Results: No results found.  Anti-infectives: Anti-infectives (From admission, onward)    Start     Dose/Rate Route Frequency Ordered Stop   04/14/23 0815  ceFAZolin (ANCEF) IVPB 2g/100 mL premix        2 g 200 mL/hr over 30 Minutes  Intravenous  Once 04/14/23 0810 04/14/23 0911   04/14/23 0700  piperacillin-tazobactam (ZOSYN) IVPB 3.375 g        3.375 g 100 mL/hr over 30 Minutes Intravenous  Once 04/14/23 0651 04/14/23 0834        Assessment/Plan POD 2, s/p ex lap with SBR for internal hernia, Dr. Maisie Fus 04/14/23  -await bowel function -cont NGT -mobilize, pulm toilet -multi-modal pain control -PT eval pending -labs in am  FEN - NPO, NGT, IVFs VTE - Lovenox ID - none needed  H/O thyroid cancer s/p resection - synthroid resumed HTN - home meds resumed Cardiac palpitations - monitor   LOS: 2 days    Letha Cape , [facility] 04/16/2023, 11:26 AM Please see Amion for pager number during day hours 7:00am-4:30pm or 7:00am -11:30am on weekends

## 2023-04-17 ENCOUNTER — Inpatient Hospital Stay (HOSPITAL_COMMUNITY): Payer: Medicare HMO

## 2023-04-17 LAB — CBC
HCT: 30.7 % — ABNORMAL LOW (ref 36.0–46.0)
Hemoglobin: 10.1 g/dL — ABNORMAL LOW (ref 12.0–15.0)
MCH: 31.5 pg (ref 26.0–34.0)
MCHC: 32.9 g/dL (ref 30.0–36.0)
MCV: 95.6 fL (ref 80.0–100.0)
Platelets: 158 10*3/uL (ref 150–400)
RBC: 3.21 MIL/uL — ABNORMAL LOW (ref 3.87–5.11)
RDW: 14.1 % (ref 11.5–15.5)
WBC: 8.1 10*3/uL (ref 4.0–10.5)
nRBC: 0 % (ref 0.0–0.2)

## 2023-04-17 LAB — BASIC METABOLIC PANEL
Anion gap: 8 (ref 5–15)
BUN: 11 mg/dL (ref 8–23)
CO2: 27 mmol/L (ref 22–32)
Calcium: 9.1 mg/dL (ref 8.9–10.3)
Chloride: 99 mmol/L (ref 98–111)
Creatinine, Ser: 0.91 mg/dL (ref 0.44–1.00)
GFR, Estimated: >60 mL/min (ref >60)
Glucose, Bld: 124 mg/dL — ABNORMAL HIGH (ref 70–99)
Potassium: 3.7 mmol/L (ref 3.5–5.1)
Sodium: 134 mmol/L — ABNORMAL LOW (ref 135–145)

## 2023-04-17 LAB — SURGICAL PATHOLOGY

## 2023-04-17 LAB — PHOSPHORUS: Phosphorus: 2 mg/dL — ABNORMAL LOW (ref 2.5–4.6)

## 2023-04-17 LAB — MAGNESIUM: Magnesium: 1.9 mg/dL (ref 1.7–2.4)

## 2023-04-17 NOTE — Care Management Important Message (Signed)
Important Message  Patient Details  Name: Grace Dyer MRN: [id number] Date of Birth: 01/23/1951   Medicare Important Message Given:  Yes     Sherilyn Banker 04/17/2023, 11:27 AM

## 2023-04-17 NOTE — Progress Notes (Signed)
Physical Therapy Treatment Patient Details Name: Grace Dyer MRN: [id number] DOB: [birth year] Today's Date: 04/17/2023   History of Present Illness Patient is a 72 year old female who presented to the ED with abdominal pain; s/p exploratory lap, Small bowel resection; Prior abdominal surgery includes abdominal hysterectomy and surgery for gastric perforation a few years ago. PMH otherwise significant for thyroid cancer s/p thyroidectomy, HTN, hypertrophic cardiomyopathy, HLD,  Bil THAs (2008, 2013)    PT Comments  Continuing work on functional mobility and activity tolerance;  Session focused on assessing response to activity -- in particular with progressive gait; Very nice progress with activity tolerance, and gait distance; Pt also showing good self-monitor for activity tolerance; Updated equipment rec from regular RW to Rollator RW;   Also got clarification re: stairs in the home; pt has a flight of steps to reach bedroom and bathroom; no bathroom options on first floor     Assistance Recommended at Discharge PRN  If plan is discharge home, recommend the following:  Can travel by private vehicle    Help with stairs or ramp for entrance;A little help with bathing/dressing/bathroom      Equipment Recommendations  Rollator (4 wheels);BSC/3in1    Recommendations for Other Services Other (comment) (Mobility)     Precautions / Restrictions Precautions Precautions: Fall (NG Tube)     Mobility  Bed Mobility Overal bed mobility: Needs Assistance Bed Mobility: Rolling, Sidelying to Sit Rolling: Supervision Sidelying to sit: Supervision       General bed mobility comments: Used bedrails; slow moving, but managing well    Transfers Overall transfer level: Needs assistance Equipment used: Rolling walker (2 wheels) Transfers: Sit to/from Stand Sit to Stand: Min guard (without physical contact)           General transfer comment: Good push up from bed/armrests of  chair    Ambulation/Gait Ambulation/Gait assistance: Min guard Gait Distance (Feet): 65 Feet (x2) Assistive device: Rolling walker (2 wheels) Gait Pattern/deviations: Step-through pattern Gait velocity: slowed     General Gait Details: Cues to self-monitor for activity tolerance; one seated rest break   Stairs             Wheelchair Mobility     Tilt Bed    Modified Rankin (Stroke Patients Only)       Balance Overall balance assessment: Mild deficits observed, not formally tested                                          Cognition Arousal/Alertness: Awake/alert Behavior During Therapy: WFL for tasks assessed/performed Overall Cognitive Status: Within Functional Limits for tasks assessed                                          Exercises      General Comments General comments (skin integrity, edema, etc.): One seated rest break during amb as pt reported feel palpitations; Took seated vitals: HR 98, BP 112/69, O2 sats 96% on room air      Pertinent Vitals/Pain Pain Assessment Pain Assessment: Faces Faces Pain Scale: Hurts little more Pain Location: Abdominal discomfort, and sore throat Pain Descriptors / Indicators: Grimacing    Home Living           Entrance Stairs-Rails: Left Entrance Stairs-Number of Steps: a few  Alternate Level Stairs-Number of Steps: Flight Home Layout: Two level;Bed/bath upstairs        Prior Function            PT Goals (current goals can now be found in the care plan section) Acute Rehab PT Goals Patient Stated Goal: Hopes to recover well PT Goal Formulation: With patient Time For Goal Achievement: 04/30/23 Potential to Achieve Goals: Good Progress towards PT goals: Progressing toward goals    Frequency    Min 3X/week      PT Plan Equipment recommendations need to be updated    Co-evaluation              AM-PAC PT "6 Clicks" Mobility   Outcome Measure  Help  needed turning from your back to your side while in a flat bed without using bedrails?: None Help needed moving from lying on your back to sitting on the side of a flat bed without using bedrails?: None Help needed moving to and from a bed to a chair (including a wheelchair)?: A Little Help needed standing up from a chair using your arms (e.g., wheelchair or bedside chair)?: A Little Help needed to walk in hospital room?: A Little Help needed climbing 3-5 steps with a railing? : A Lot 6 Click Score: 19    End of Session Equipment Utilized During Treatment: Gait belt Activity Tolerance: Patient tolerated treatment well Patient left: in chair;with call bell/phone within reach;with chair alarm set Nurse Communication: Mobility status PT Visit Diagnosis: Unsteadiness on feet (R26.81);Other abnormalities of gait and mobility (R26.89);Muscle weakness (generalized) (M62.81)     Time: 1610-9604 PT Time Calculation (min) (ACUTE ONLY): 30 min  Charges:    $Gait Training: 23-37 mins PT General Charges $$ ACUTE PT VISIT: 1 Visit                     Van Clines, PT  Acute Rehabilitation Services Office [phone] Secure Chat welcomed    Levi Aland 04/17/2023, 3:23 PM

## 2023-04-17 NOTE — Plan of Care (Signed)
  Problem: Clinical Measurements: Goal: Will remain free from infection Outcome: Progressing   Problem: Clinical Measurements: Goal: Diagnostic test results will improve Outcome: Progressing   Problem: Clinical Measurements: Goal: Respiratory complications will improve Outcome: Progressing   Problem: Activity: Goal: Risk for activity intolerance will decrease Outcome: Progressing   Problem: Nutrition: Goal: Adequate nutrition will be maintained Outcome: Progressing   

## 2023-04-17 NOTE — Progress Notes (Signed)
3 Days Post-Op   Subjective/Chief Complaint: No complaints Pain controlled No flatus yet Still with high NG output   Objective: Vital signs in last 24 hours: Temp:  [98 F (36.7 C)-98.7 F (37.1 C)] 98 F (36.7 C) (07/10 0620) Pulse Rate:  [45-102] 93 (07/10 0620) Resp:  [17-18] 17 (07/10 0620) BP: (98-113)/(65-73) 103/65 (07/10 0620) SpO2:  [92 %-96 %] 96 % (07/10 0620) Last BM Date : 04/14/23  Intake/Output from previous day: 07/09 0701 - 07/10 0700 In: -  Out: 2050 [Emesis/NG output:2050] Intake/Output this shift: No intake/output data recorded.  Exam: Awake and alert Abdomen soft, non-distended, dressing dry, minimally tender  Lab Results:  Recent Labs    04/15/23 2358 04/17/23 0255  WBC 9.0 8.1  HGB 11.3* 10.1*  HCT 34.4* 30.7*  PLT 148* 158   BMET Recent Labs    04/15/23 2358 04/17/23 0255  NA 137 134*  K 3.9 3.7  CL 101 99  CO2 26 27  GLUCOSE 120* 124*  BUN 16 11  CREATININE 0.93 0.91  CALCIUM 9.5 9.1   PT/INR No results for input(s): "LABPROT", "INR" in the last 72 hours. ABG Recent Labs    04/14/23 0915  PHART 7.364  HCO3 22.8    Studies/Results: No results found.  Anti-infectives: Anti-infectives (From admission, onward)    Start     Dose/Rate Route Frequency Ordered Stop   04/14/23 0815  ceFAZolin (ANCEF) IVPB 2g/100 mL premix        2 g 200 mL/hr over 30 Minutes Intravenous  Once 04/14/23 0810 04/14/23 0911   04/14/23 0700  piperacillin-tazobactam (ZOSYN) IVPB 3.375 g        3.375 g 100 mL/hr over 30 Minutes Intravenous  Once 04/14/23 0651 04/14/23 0834       Assessment/Plan: POD 3, s/p ex lap with SBR for internal hernia, Dr. Maisie Fus 04/14/23   -continue NPO and NG -check abdominal films today to assess ileus -ambulate   Abigail Miyamoto MD 04/17/2023

## 2023-04-17 NOTE — Plan of Care (Signed)

## 2023-04-18 LAB — BASIC METABOLIC PANEL
Anion gap: 9 (ref 5–15)
BUN: 9 mg/dL (ref 8–23)
CO2: 29 mmol/L (ref 22–32)
Calcium: 9.3 mg/dL (ref 8.9–10.3)
Chloride: 100 mmol/L (ref 98–111)
Creatinine, Ser: 1.09 mg/dL — ABNORMAL HIGH (ref 0.44–1.00)
GFR, Estimated: 54 mL/min — ABNORMAL LOW (ref >60)
Glucose, Bld: 106 mg/dL — ABNORMAL HIGH (ref 70–99)
Potassium: 3.6 mmol/L (ref 3.5–5.1)
Sodium: 138 mmol/L (ref 135–145)

## 2023-04-18 NOTE — Plan of Care (Signed)
  Problem: Clinical Measurements: Goal: Ability to maintain clinical measurements within normal limits will improve Outcome: Progressing   Problem: Clinical Measurements: Goal: Will remain free from infection Outcome: Progressing   Problem: Clinical Measurements: Goal: Diagnostic test results will improve Outcome: Progressing   Problem: Clinical Measurements: Goal: Respiratory complications will improve Outcome: Progressing   Problem: Clinical Measurements: Goal: Cardiovascular complication will be avoided Outcome: Progressing   Problem: Activity: Goal: Risk for activity intolerance will decrease Outcome: Progressing   

## 2023-04-18 NOTE — Progress Notes (Signed)
4 Days Post-Op   Subjective/Chief Complaint: Passing flatus. NG output decreasing. Pain well-controlled.   Objective: Vital signs in last 24 hours: Temp:  [98 F (36.7 C)-98.9 F (37.2 C)] 98.9 F (37.2 C) (07/11 0737) Pulse Rate:  [79-91] 89 (07/11 0737) Resp:  [16-19] 16 (07/11 0737) BP: (91-126)/(54-69) 103/67 (07/11 0737) SpO2:  [94 %-98 %] 98 % (07/11 0737) Last BM Date : 04/14/23  Intake/Output from previous day: 07/10 0701 - 07/11 0700 In: -  Out: 800 [Emesis/NG output:800] Intake/Output this shift: No intake/output data recorded.  Exam: Awake and alert, sitting up in bed NG in place draining gastric contents Abdomen soft, non-distended, honeycomb dressing over midline incision, nontender.  Lab Results:  Recent Labs    04/15/23 2358 04/17/23 0255  WBC 9.0 8.1  HGB 11.3* 10.1*  HCT 34.4* 30.7*  PLT 148* 158   BMET Recent Labs    04/15/23 2358 04/17/23 0255  NA 137 134*  K 3.9 3.7  CL 101 99  CO2 26 27  GLUCOSE 120* 124*  BUN 16 11  CREATININE 0.93 0.91  CALCIUM 9.5 9.1   PT/INR No results for input(s): "LABPROT", "INR" in the last 72 hours. ABG No results for input(s): "PHART", "HCO3" in the last 72 hours.  Invalid input(s): "PCO2", "PO2"   Studies/Results: DG Abd 1 View  Result Date: 04/17/2023 CLINICAL DATA:  Ileus EXAM: ABDOMEN - 1 VIEW COMPARISON:  None Available. FINDINGS: Mildly dilated loops of small bowel are noted. No free air is seen. Colon appears within normal limits. Postoperative changes are noted. Bilateral hip replacements are noted. Gastric catheter is noted in the stomach. IMPRESSION: Small-bowel dilatation likely related to postoperative ileus. Electronically Signed   By: Alcide Clever M.D.   On: 04/17/2023 13:37    Anti-infectives: Anti-infectives (From admission, onward)    Start     Dose/Rate Route Frequency Ordered Stop   04/14/23 0815  ceFAZolin (ANCEF) IVPB 2g/100 mL premix        2 g 200 mL/hr over 30 Minutes  Intravenous  Once 04/14/23 0810 04/14/23 0911   04/14/23 0700  piperacillin-tazobactam (ZOSYN) IVPB 3.375 g        3.375 g 100 mL/hr over 30 Minutes Intravenous  Once 04/14/23 0651 04/14/23 0834       Assessment/Plan: POD 4, s/p ex lap with SBR for internal hernia, Dr. Maisie Fus 04/14/23  - Passing flatus. NG clamped this morning, remove this afternoon if tolerating clamping trials. - NPO ice chips, advance to clear liquids once NG removed - Multimodal pain control - Ambulate - VTE: lovenox, SCDs - Dispo: inpatient, med-surg floor. HHPT at discharge.   Fritzi Mandes MD 04/18/2023

## 2023-04-19 LAB — CBC
HCT: 27.1 % — ABNORMAL LOW (ref 36.0–46.0)
Hemoglobin: 9 g/dL — ABNORMAL LOW (ref 12.0–15.0)
MCH: 31.9 pg (ref 26.0–34.0)
MCHC: 33.2 g/dL (ref 30.0–36.0)
MCV: 96.1 fL (ref 80.0–100.0)
Platelets: 174 10*3/uL (ref 150–400)
RBC: 2.82 MIL/uL — ABNORMAL LOW (ref 3.87–5.11)
RDW: 14 % (ref 11.5–15.5)
WBC: 7.5 10*3/uL (ref 4.0–10.5)
nRBC: 0 % (ref 0.0–0.2)

## 2023-04-19 NOTE — Plan of Care (Signed)

## 2023-04-19 NOTE — Progress Notes (Addendum)
5 Days Post-Op   Subjective/Chief Complaint: Passing flatus. Tolerating clears. Says she feels hungry. Has not walked in the hallway in a couple days.    Objective: Vital signs in last 24 hours: Temp:  [98.2 F (36.8 C)-98.7 F (37.1 C)] 98.5 F (36.9 C) (07/12 0841) Pulse Rate:  [73-88] 73 (07/12 0841) Resp:  [16] 16 (07/12 0841) BP: (102-112)/(62-73) 109/66 (07/12 0841) SpO2:  [97 %-99 %] 99 % (07/12 0841) Last BM Date : 04/14/23  Intake/Output from previous day: 07/11 0701 - 07/12 0700 In: 960 [P.O.:960] Out: -  Intake/Output this shift: No intake/output data recorded.  Exam: Awake and alert, sitting up EOB Abdomen soft, non-distended, honeycomb dressing over midline incision, nontender.  Lab Results:  Recent Labs    04/17/23 0255 04/19/23 0026  WBC 8.1 7.5  HGB 10.1* 9.0*  HCT 30.7* 27.1*  PLT 158 174   BMET Recent Labs    04/17/23 0255 04/18/23 1047  NA 134* 138  K 3.7 3.6  CL 99 100  CO2 27 29  GLUCOSE 124* 106*  BUN 11 9  CREATININE 0.91 1.09*  CALCIUM 9.1 9.3   PT/INR No results for input(s): "LABPROT", "INR" in the last 72 hours. ABG No results for input(s): "PHART", "HCO3" in the last 72 hours.  Invalid input(s): "PCO2", "PO2"   Studies/Results: DG Abd 1 View  Result Date: 04/17/2023 CLINICAL DATA:  Ileus EXAM: ABDOMEN - 1 VIEW COMPARISON:  None Available. FINDINGS: Mildly dilated loops of small bowel are noted. No free air is seen. Colon appears within normal limits. Postoperative changes are noted. Bilateral hip replacements are noted. Gastric catheter is noted in the stomach. IMPRESSION: Small-bowel dilatation likely related to postoperative ileus. Electronically Signed   By: Alcide Clever M.D.   On: 04/17/2023 13:37    Anti-infectives: Anti-infectives (From admission, onward)    Start     Dose/Rate Route Frequency Ordered Stop   04/14/23 0815  ceFAZolin (ANCEF) IVPB 2g/100 mL premix        2 g 200 mL/hr over 30 Minutes Intravenous   Once 04/14/23 0810 04/14/23 0911   04/14/23 0700  piperacillin-tazobactam (ZOSYN) IVPB 3.375 g        3.375 g 100 mL/hr over 30 Minutes Intravenous  Once 04/14/23 0651 04/14/23 0834       Assessment/Plan: POD 5, s/p ex lap with SBR for internal hernia, Dr. Maisie Fus 04/14/23  - Passing flatus. Tolerating CLD, abdomen soft, no nausea. Advance to FLD. - Multimodal pain control - Ambulate - VTE: lovenox, SCDs - Dispo: inpatient, med-surg floor. HHPT at discharge.   Francine Graven Byrd Rushlow PA-C 04/19/2023

## 2023-04-19 NOTE — Progress Notes (Signed)
Physical Therapy Treatment Patient Details Name: Grace Dyer MRN: [id number] DOB: 1951/10/03 Today's Date: 04/19/2023   History of Present Illness Patient is a 72 year old female who presented to the ED with abdominal pain; s/p exploratory lap, Small bowel resection; Prior abdominal surgery includes abdominal hysterectomy and surgery for gastric perforation a few years ago. PMH otherwise significant for thyroid cancer s/p thyroidectomy, HTN, hypertrophic cardiomyopathy, HLD,  Bil THAs (2008, 2013)    PT Comments  Pt tolerated treatment well today. Pt was able to progress ambulation in hallway with RW and navigate 3 stairs at supervision level. Pt has 12 steps to get to bedroom at home and steps today were limited due to IV line and pt reporting dizziness which pt attributes to temperature of stairwell. No change in DC/DME recs at this time. Trial rollator and retry stairs next session.    Assistance Recommended at Discharge PRN  If plan is discharge home, recommend the following:  Can travel by private vehicle    Help with stairs or ramp for entrance;A little help with bathing/dressing/bathroom      Equipment Recommendations  Rollator (4 wheels);BSC/3in1    Recommendations for Other Services       Precautions / Restrictions Precautions Precautions: Fall Precaution Comments: Watch HR and dizziness with activity Restrictions Weight Bearing Restrictions: No     Mobility  Bed Mobility Overal bed mobility: Modified Independent Bed Mobility: Supine to Sit     Supine to sit: Modified independent (Device/Increase time)     General bed mobility comments: Used bedrails; slow moving, but managing well    Transfers Overall transfer level: Modified independent Equipment used: Rolling walker (2 wheels) Transfers: Sit to/from Stand Sit to Stand: Modified independent (Device/Increase time)           General transfer comment: Pt demonstrated proper hand placement     Ambulation/Gait Ambulation/Gait assistance: Supervision Gait Distance (Feet): 200 Feet Assistive device: Rolling walker (2 wheels) Gait Pattern/deviations: Step-through pattern Gait velocity: slowed     General Gait Details: no LOB noted. Cues to self monitor activity tolerance.   Stairs Stairs: Yes Stairs assistance: Supervision Stair Management: One rail Left, Alternating pattern, Forwards Number of Stairs: 3 General stair comments: Distance limited by IV line and pt reporting dizziness. no LOB noted.   Wheelchair Mobility     Tilt Bed    Modified Rankin (Stroke Patients Only)       Balance Overall balance assessment: Mild deficits observed, not formally tested                                          Cognition Arousal/Alertness: Awake/alert Behavior During Therapy: WFL for tasks assessed/performed Overall Cognitive Status: Within Functional Limits for tasks assessed                                          Exercises      General Comments General comments (skin integrity, edema, etc.): VSS on RA      Pertinent Vitals/Pain Pain Assessment Pain Assessment: No/denies pain    Home Living                          Prior Function  PT Goals (current goals can now be found in the care plan section) Progress towards PT goals: Progressing toward goals    Frequency    Min 3X/week      PT Plan Current plan remains appropriate    Co-evaluation              AM-PAC PT "6 Clicks" Mobility   Outcome Measure  Help needed turning from your back to your side while in a flat bed without using bedrails?: None Help needed moving from lying on your back to sitting on the side of a flat bed without using bedrails?: None Help needed moving to and from a bed to a chair (including a wheelchair)?: A Little Help needed standing up from a chair using your arms (e.g., wheelchair or bedside chair)?: A  Little Help needed to walk in hospital room?: A Little Help needed climbing 3-5 steps with a railing? : A Little 6 Click Score: 20    End of Session Equipment Utilized During Treatment: Gait belt Activity Tolerance: Patient tolerated treatment well Patient left: in bed;with family/visitor present (Seated EOB) Nurse Communication: Mobility status PT Visit Diagnosis: Unsteadiness on feet (R26.81);Other abnormalities of gait and mobility (R26.89);Muscle weakness (generalized) (M62.81)     Time: 8295-6213 PT Time Calculation (min) (ACUTE ONLY): 14 min  Charges:    $Gait Training: 8-22 mins PT General Charges $$ ACUTE PT VISIT: 1 Visit                     Grace Dyer, PT, DPT Acute Rehab Services [phone]    Grace Dyer 04/19/2023, 12:25 PM

## 2023-04-19 NOTE — TOC Progression Note (Signed)
Transition of Care (TOC) - Progression Note   Home health PT arranged with [facility] .   Ordered rolling walker and bedside commode with Yolanda with [employer]  Patient Details  Name: Grace Dyer MRN: [id number] Date of Birth: [birth year]  Transition of Care [facility]) CM/SW Contact  Nadene Rubins, Adria Devon, RN Phone Number: 04/19/2023, 11:01 AM  Clinical Narrative:       Expected Discharge Plan: Home w Home Health Services Barriers to Discharge: Continued Medical Work up  Expected Discharge Plan and Services   Discharge Planning Services: CM Consult Post Acute Care Choice: Durable Medical Equipment, Home Health                   DME Arranged: 3-N-1, Walker rolling         HH Arranged: PT HH Agency: [facility] Date [facility] Agency Contacted: 04/16/23 Time HH Agency Contacted: 1451 Representative spoke with at [facility] Agency: Kandee Keen   Social Determinants of Health (SDOH) Interventions SDOH Screenings   Food Insecurity: No Food Insecurity (10/15/2022)  Housing: Low Risk  (10/15/2022)  Transportation Needs: No Transportation Needs (10/15/2022)  Utilities: Not At Risk (10/15/2022)  Tobacco Use: Medium Risk (04/15/2023)    Readmission Risk Interventions     No data to display

## 2023-04-20 MED ORDER — METHOCARBAMOL 500 MG PO TABS
500.0000 mg | ORAL_TABLET | Freq: Three times a day (TID) | ORAL | Status: DC
  Administered 2023-04-20 – 2023-04-21 (×4): 500 mg via ORAL
  Filled 2023-04-20 (×4): qty 1

## 2023-04-20 MED ORDER — ORAL CARE MOUTH RINSE: 15.0000 mL | OROMUCOSAL | Status: DC | PRN

## 2023-04-20 NOTE — Plan of Care (Signed)
  Problem: Clinical Measurements: Goal: Ability to maintain clinical measurements within normal limits will improve Outcome: Progressing   Problem: Clinical Measurements: Goal: Will remain free from infection Outcome: Progressing   Problem: Clinical Measurements: Goal: Diagnostic test results will improve Outcome: Progressing   Problem: Clinical Measurements: Goal: Respiratory complications will improve Outcome: Progressing   Problem: Activity: Goal: Risk for activity intolerance will decrease Outcome: Progressing   

## 2023-04-20 NOTE — Progress Notes (Signed)
BP earlier this am was 80/59 but was rechecked and ok. Did not initiate the yellow MEWS since rechecked and OK. Will continue to monitor BP.

## 2023-04-20 NOTE — Plan of Care (Signed)
  Problem: Clinical Measurements: Goal: Ability to maintain clinical measurements within normal limits will improve Outcome: Progressing   Problem: Clinical Measurements: Goal: Will remain free from infection Outcome: Progressing   Problem: Clinical Measurements: Goal: Diagnostic test results will improve Outcome: Progressing   Problem: Clinical Measurements: Goal: Respiratory complications will improve Outcome: Progressing   Problem: Activity: Goal: Risk for activity intolerance will decrease Outcome: Progressing   Problem: Nutrition: Goal: Adequate nutrition will be maintained Outcome: Progressing   

## 2023-04-20 NOTE — Progress Notes (Signed)
Pt tolerating soft diet well, no nausea. Pt passing gas and had 2 BMs today. Ambulated in the hallway with min. Assist with RW.

## 2023-04-20 NOTE — Progress Notes (Signed)
    Assessment & Plan: POD#6 - s/p ex lap with SBR for internal hernia, Dr. Maisie Fus 04/14/23   - tolerating FLD, advance to soft diet today - Ambulate in halls today  - VTE: lovenox, SCDs - Dispo: inpatient, med-surg floor. HHPT at discharge.  Doing well.  Anticipate discharge home tomorrow if diet tolerated.  Patient to arrange with family.        Darnell Level, MD [facility] practice Office: [phone]        Chief Complaint: SBO, internal hernia  Subjective: Patient pleasant, no complaints.  Nurse at bedside.  Objective: Vital signs in last 24 hours: Temp:  [98.1 F (36.7 C)-99.1 F (37.3 C)] 98.1 F (36.7 C) (07/13 0749) Pulse Rate:  [64-88] 64 (07/13 0733) Resp:  [16-20] 20 (07/13 0749) BP: (80-125)/(59-84) 102/60 (07/13 1035) SpO2:  [98 %-100 %] 100 % (07/13 0749) Last BM Date : 04/20/23  Intake/Output from previous day: No intake/output data recorded. Intake/Output this shift: No intake/output data recorded.  Physical Exam: HEENT - sclerae clear, mucous membranes moist Neck - soft, well healed Kocher incision Abdomen - soft, midline with dry with dressing intact Ext - no edema, non-tender Neuro - alert & oriented, no focal deficits  Lab Results:  Recent Labs    04/19/23 0026  WBC 7.5  HGB 9.0*  HCT 27.1*  PLT 174   BMET Recent Labs    04/18/23 1047  NA 138  K 3.6  CL 100  CO2 29  GLUCOSE 106*  BUN 9  CREATININE 1.09*  CALCIUM 9.3   PT/INR No results for input(s): "LABPROT", "INR" in the last 72 hours. Comprehensive Metabolic Panel:    Component Value Date/Time   NA 138 04/18/2023 1047   NA 134 (L) 04/17/2023 0255   K 3.6 04/18/2023 1047   K 3.7 04/17/2023 0255   CL 100 04/18/2023 1047   CL 99 04/17/2023 0255   CO2 29 04/18/2023 1047   CO2 27 04/17/2023 0255   BUN 9 04/18/2023 1047   BUN 11 04/17/2023 0255   CREATININE 1.09 (H) 04/18/2023 1047   CREATININE 0.91 04/17/2023 0255   GLUCOSE 106 (H)  04/18/2023 1047   GLUCOSE 124 (H) 04/17/2023 0255   CALCIUM 9.3 04/18/2023 1047   CALCIUM 9.1 04/17/2023 0255   AST 24 04/14/2023 0551   AST 20 06/06/2022 0038   ALT 24 04/14/2023 0551   ALT 17 06/06/2022 0038   ALKPHOS 88 04/14/2023 0551   ALKPHOS 96 06/06/2022 0038   BILITOT 1.6 (H) 04/14/2023 0551   BILITOT 0.5 06/06/2022 0038   PROT 7.4 04/14/2023 0551   PROT 7.3 06/06/2022 0038   ALBUMIN 3.8 04/14/2023 0551   ALBUMIN 4.1 10/29/2022 0829    Studies/Results: No results found.    Darnell Level 04/20/2023  Patient ID: Grace Dyer, female   DOB: 08/24/1951, 72 y.o.   MRN: [id number]

## 2023-04-21 MED ORDER — TRAMADOL HCL 50 MG PO TABS: 50.0000 mg | ORAL_TABLET | Freq: Four times a day (QID) | ORAL | 0 refills | Status: AC | PRN

## 2023-04-21 NOTE — Discharge Instructions (Signed)
[facility]  OPEN ABDOMINAL SURGERY: POST OP INSTRUCTIONS  Always review your discharge instruction sheet given to you by the facility where your surgery was performed.  A prescription for pain medication may be given to you upon discharge.  Take your pain medication as prescribed.  If narcotic pain medicine is not needed, then you may take acetaminophen (Tylenol) or ibuprofen (Advil) as needed. Take your usually prescribed medications unless otherwise directed. If you need a refill on your pain medication, please contact your pharmacy. They will contact our office to request authorization.  Prescriptions will not be filled after 5 pm or on weekends. You should follow a light diet the first few days after arrival home, such as soup and crackers, unless your doctor has advised otherwise. A high-fiber, low fat diet can be resumed as tolerated.  Be sure to include plenty of fluids daily.  Most patients will experience some swelling and bruising in the area of the incision. Ice packs will help. Swelling and bruising can take several days to resolve. It is common to experience some constipation if taking pain medication after surgery.  Increasing fluid intake and taking a stool softener will usually help or prevent this problem from occurring.  A mild laxative (Milk of Magnesia or Miralax) should be taken according to package directions if there are no bowel movements after 48 hours.  You may have steri-strips (small skin tapes) in place directly over the incision.  These strips should be left on the skin for 5-7 days.  Any sutures or staples will be removed at the office during your follow-up visit. You may find that a light gauze bandage over your incision may keep your staples from being rubbed or pulled. You may shower and replace the bandage daily. ACTIVITIES:  You may resume regular (light) daily activities beginning the next day - such as daily self-care, walking, climbing stairs -  gradually increasing activities as tolerated.  You may have sexual intercourse when it is comfortable.  Refrain from any heavy lifting or straining until approved by your doctor.  You may drive when you no longer are taking prescription pain medication, you can comfortably wear a seatbelt, and you can safely maneuver your car and apply brakes. You should see your doctor in the office for a follow-up appointment approximately 2-3 weeks after your surgery.  Make sure that you call for this appointment within a day or two after you arrive home to insure a convenient appointment time.  WHEN TO CALL YOUR DOCTOR: Fever greater than 101.0 Inability to urinate Persistent nausea and/or vomiting Extreme swelling or bruising Continued bleeding from incision Increased pain, redness, or drainage from the incision Difficulty swallowing or breathing Muscle cramping or spasms Numbness or tingling in hands or around lips  IF YOU HAVE DISABILITY OR FAMILY LEAVE FORMS, YOU MUST BRING THEM TO THE OFFICE FOR PROCESSING.  PLEASE DO NOT GIVE THEM TO YOUR DOCTOR.  The clinic staff is available to answer your questions during regular business hours.  Please don't hesitate to call and ask to speak to one of the nurses if you have concerns.  [facility] Office: [phone]  For further questions, please visit [email or web address]   

## 2023-04-21 NOTE — TOC Transition Note (Signed)
Transition of Care [facility]) - CM/SW Discharge Note   Patient Details  Name: Grace Dyer MRN: [id number] Date of Birth: 1951/01/24  Transition of [facility]) CM/SW Contact:  Ronny Bacon, RN Phone Number: 04/21/2023, 11:48 AM   Clinical Narrative:   Patient is being discharged home today. [name] aware of patient being discharged home.    Final next level of care: Home w Home Health Services Barriers to Discharge: No Barriers Identified   Patient Goals and CMS Choice CMS Medicare.gov Compare Post Acute Care list provided to:: Patient Choice offered to / list presented to : Patient  Discharge Placement                         Discharge Plan and Services Additional resources added to the After Visit Summary for     Discharge Planning Services: CM Consult Post Acute Care Choice: Durable Medical Equipment, Home Health          DME Arranged: 3-N-1, Walker rolling         HH Arranged: PT HH Agency: [facility] Date [facility] Agency Contacted: 04/16/23 Time HH Agency Contacted: 1451 Representative spoke with at [facility] Agency: Kandee Keen  Social Determinants of Health (SDOH) Interventions SDOH Screenings   Food Insecurity: No Food Insecurity (10/15/2022)  Housing: Low Risk  (10/15/2022)  Transportation Needs: No Transportation Needs (10/15/2022)  Utilities: Not At Risk (10/15/2022)  Tobacco Use: Medium Risk (04/14/2023)     Readmission Risk Interventions     No data to display

## 2023-04-21 NOTE — Discharge Summary (Signed)
Physician Discharge Summary   Patient ID: Grace Dyer MRN: [id number] DOB/AGE: 06/12/1951 72 y.o.  Admit date: 04/14/2023  Discharge date: 04/21/2023  Discharge Diagnoses:  Principal Problem:   Internal hernia   Discharged Condition: good  Hospital Course: Patient was admitted for observation following exploratory laparotomy and small bowel resection.  Post op course was uncomplicated.  Pain was well controlled.  Tolerated diet.  Patient was prepared for discharge home on POD#7.  Consults: None  Treatments: surgery: exploratory laparotomy and small bowel resection  Discharge Exam: Blood pressure 115/70, pulse 69, temperature 98.8 F (37.1 C), temperature source Oral, resp. rate 15, height 5\' 3"  (1.6 m), weight 73.5 kg, SpO2 100%. HEENT - clear Abd - soft without distension; wound dry and intact with dressing in place  Disposition: Home  Discharge Instructions     Diet - low sodium heart healthy   Complete by: As directed    Increase activity slowly   Complete by: As directed    No dressing needed   Complete by: As directed    May remove dressing and leave wound open to the air.  May shower.      Allergies as of 04/21/2023       Reactions   Codeine Other (See Comments)   Dizziness   Shellfish Allergy Nausea And Vomiting   Specifically shrimp and scallops        Medication List     STOP taking these medications    HYDROcodone-acetaminophen 5-325 MG tablet Commonly known as: NORCO/VICODIN       TAKE these medications    ADULT GUMMY PO Take 2 each by mouth daily.   fluticasone 50 MCG/ACT nasal spray Commonly known as: FLONASE Place 1 spray into both nostrils daily. Begin by using 2 sprays in each nare daily for 3 to 5 days, then decrease to 1 spray in each nare daily. What changed:  when to take this reasons to take this additional instructions   levocetirizine 5 MG tablet Commonly known as: XYZAL Take 1 tablet (5 mg total) by mouth  every evening.   levothyroxine 125 MCG tablet Commonly known as: SYNTHROID Take 1 tablet (125 mcg total) by mouth daily.   metoprolol succinate 100 MG 24 hr tablet Commonly known as: TOPROL-XL Take 1 tablet (100 mg total) by mouth daily.   metoprolol tartrate 25 MG tablet Commonly known as: LOPRESSOR Take 1 tablet (25 mg total) by mouth 2 (two) times daily as needed for up to 30 doses (palpitations).   NIFEdipine 60 MG 24 hr tablet Commonly known as: PROCARDIA XL/NIFEDICAL XL Take 60 mg by mouth daily.   rosuvastatin 10 MG tablet Commonly known as: CRESTOR Take 10 mg by mouth every evening.   traMADol 50 MG tablet Commonly known as: ULTRAM Take 1 tablet (50 mg total) by mouth every 6 (six) hours as needed for moderate pain.               Durable Medical Equipment  (From admission, onward)           Start     Ordered   04/16/23 1456  For home use only DME Bedside commode  Once       Question:  Patient needs a bedside commode to treat with the following condition  Answer:  Weakness   04/16/23 1456   04/16/23 1455  For home use only DME Walker rolling  Once       Question Answer Comment  Walker: With 5  Inch Wheels   Patient needs a walker to treat with the following condition Weakness      04/16/23 1454   04/16/23 1455  For home use only DME 3 n 1  Once        04/16/23 1454              Discharge Care Instructions  (From admission, onward)           Start     Ordered   04/21/23 0000  No dressing needed       Comments: May remove dressing and leave wound open to the air.  May shower.   04/21/23 1049            Follow-up Information     Care, [facility] Follow up.   Specialty: Home Health Services Contact information: [address] [place] [address] [phone]         [facility] [place]. Schedule an appointment as soon as possible for a visit in 5 day(s).   Specialty: General Surgery Why: For  suture removal Contact information: [address] [place] [address] [phone]                Darnell Level, MD [facility] Office: [phone]   Signed: Darnell Level 04/21/2023, 10:49 AM

## 2023-04-21 NOTE — Plan of Care (Signed)
Pt ready for discharge to home today. AVS reviewed and copy given.
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
## 2023-05-02 ENCOUNTER — Encounter: Payer: Self-pay | Admitting: Internal Medicine

## 2023-05-02 ENCOUNTER — Ambulatory Visit: Payer: Medicare HMO | Admitting: Internal Medicine

## 2023-05-02 ENCOUNTER — Telehealth: Payer: Self-pay | Admitting: Internal Medicine

## 2023-05-02 VITALS — BP 120/80 | HR 68 | Ht 63.0 in | Wt 165.0 lb

## 2023-05-02 DIAGNOSIS — E559 Vitamin D deficiency, unspecified: Secondary | ICD-10-CM

## 2023-05-02 DIAGNOSIS — E890 Postprocedural hypothyroidism: Secondary | ICD-10-CM

## 2023-05-02 DIAGNOSIS — E213 Hyperparathyroidism, unspecified: Secondary | ICD-10-CM | POA: Diagnosis not present

## 2023-05-02 LAB — ALBUMIN: Albumin: 3.6 g/dL (ref 3.5–5.2)

## 2023-05-02 LAB — VITAMIN D 25 HYDROXY (VIT D DEFICIENCY, FRACTURES): VITD: 12.12 ng/mL — ABNORMAL LOW (ref 30.00–100.00)

## 2023-05-02 LAB — TSH: TSH: 20.78 u[IU]/mL — ABNORMAL HIGH (ref 0.35–5.50)

## 2023-05-02 MED ORDER — LEVOTHYROXINE SODIUM 150 MCG PO TABS: 150.0000 ug | ORAL_TABLET | Freq: Every day | ORAL | 3 refills | Status: DC

## 2023-05-02 MED ORDER — ERGOCALCIFEROL 1.25 MG (50000 UT) PO CAPS: 50000.0000 [IU] | ORAL_CAPSULE | ORAL | 3 refills | Status: DC

## 2023-05-02 NOTE — Telephone Encounter (Signed)
Please let the patient know that her thyroid levels are worse and she needs more levothyroxine  Please stop levothyroxine 125 mcg and START levothyroxine 150 mcg daily..  Please make sure the patient uses a pillbox so she make sure she takes levothyroxine every day.   Vitamin D is worse, please let her know that I prescribed ergocalciferol to be taken ONCE weekly from now on    Thanks

## 2023-05-02 NOTE — Telephone Encounter (Signed)
I tried to reach [employer] by phone I called twice and got no answer nor answering machine , She is not currently signed up for Mychart

## 2023-05-02 NOTE — Patient Instructions (Signed)
  Stay Hydrated  Avoid over the counter calcium tablet  Consume 2-3 servings of calcium in the food daily ( low fat dairy and green leafy vegetables) Vitamin D 2000 iu daily    You are on levothyroxine - which is your thyroid hormone supplement. You MUST take this consistently.  You should take this first thing in the morning on an empty stomach with water. You should not take it with other medications. Wait to 1hr prior to eating. If you are taking any vitamins - please take these in the evening.   If you miss a dose, please take your missed dose the following day (double the dose for that day). You should have a pill box for ONLY levothyroxine on your bedside table to help you remember to take your medications.

## 2023-05-02 NOTE — Progress Notes (Signed)
Name: Grace Dyer  MRN/ DOB: [id number], [birth year]    Age/ Sex: 72 y.o., female    PCP: Ellyn Hack, MD   Reason for Endocrinology Evaluation: MNG/ Hypercalcemia      Date of Initial Endocrinology Evaluation: 01/04/2022    HPI: [name] is a 72 y.o. female with a past medical history of HTN, cardiomyopathy,SVT and Dyslipidemia . The patient presented for initial endocrinology clinic visit on 01/04/2022 for consultative assistance with her Hypercalcemia .   [name] indicates that she was first diagnosed with hypercalcemia in 2023.   She has no prior history of lithium, or HCTZ use  She denies  history of kidney stones, kidney disease, liver disease, granulomatous disease. She has osteoporosis but no  prior fractures.  She denies  family history of osteoporosis, parathyroid disease, thyroid disease.   She has Hx of MNG , was seen by ENT while living in [place] years ago, no hx of FNa's  in the past  Has occasional local neck swelling   24-hour urine collection for calcium 179 mg on 10/25/2021 DXA 10/2021- normal AP T-score -0.5, 1/3rd forearm -0.9    On her initial visit to our clinic she was noted with low vitamin D, she was started on a OTC vitamin D3 2000 IU daily     THYROID HISTORY: She was diagnosed with multinodular goiter on thyroid ultrasound 04/2022.  She is s/p right mid nodule FNA 05/11/2022 with atypia of undetermined significance (Bethesda categoryIII), with benign Afirma.  She is s/p FNA of the right inferior nodule 05/11/2022 with atypia of undetermined significance (Bethesda category III) with Afirma suspicious   She is s/p total thyroidectomy 10/15/2022 with benign pathology  She was started on LT-for replacement  SUBJECTIVE:     Today (05/02/23): [name] is here for follow-up on hypercalcemia, hyperparathyroidism, and postoperative hypothyroid   Total thyroidectomy 10/2022 benign pathology  She is s/p exploratory laparotomy with  reduction of internal hernia and small bowel resection 04/2023, she is healing but has low appetite with incremental oral intake  Denies constipation or diarrhea  Denies fever  She has been out of vitamin D for ~ 2 weeks    Vitamin D 2000 IU daily Levothyroxine 125 mcg daily   HISTORY:  Past Medical History:  Past Medical History:  Diagnosis Date   Aortic atherosclerosis (HCC)    Cancer (HCC)    Thyroid   Dyslipidemia    Dyspnea    HCM   Heart murmur    Hypertension    Hypertrophic cardiomyopathy (HCC)    Osteoporosis    Palpitations    Sciatica    Scoliosis    Past Surgical History:   Social History:  reports that she quit smoking about 3 years ago. Her smoking use included cigarettes. She started smoking about 23 years ago. She has a 5 pack-year smoking history. She has never been exposed to tobacco smoke. She has never used smokeless tobacco. She reports current alcohol use. She reports that she does not use drugs. Family History: family history is not on file.   HOME MEDICATIONS: Allergies as of 05/02/2023       Reactions   Codeine Other (See Comments)   Dizziness   Shellfish Allergy Nausea And Vomiting   Specifically shrimp and scallops        Medication List        Accurate as of May 02, 2023  2:57 PM. If you have any questions, ask your  nurse or doctor.          ADULT GUMMY PO Take 2 each by mouth daily.   fluticasone 50 MCG/ACT nasal spray Commonly known as: FLONASE Place 1 spray into both nostrils daily. Begin by using 2 sprays in each nare daily for 3 to 5 days, then decrease to 1 spray in each nare daily. What changed:  when to take this reasons to take this additional instructions   levocetirizine 5 MG tablet Commonly known as: XYZAL Take 1 tablet (5 mg total) by mouth every evening.   levothyroxine 125 MCG tablet Commonly known as: SYNTHROID Take 1 tablet (125 mcg total) by mouth daily.   metoprolol succinate 100 MG 24 hr  tablet Commonly known as: TOPROL-XL Take 1 tablet (100 mg total) by mouth daily.   metoprolol tartrate 25 MG tablet Commonly known as: LOPRESSOR Take 1 tablet (25 mg total) by mouth 2 (two) times daily as needed for up to 30 doses (palpitations).   NIFEdipine 60 MG 24 hr tablet Commonly known as: PROCARDIA XL/NIFEDICAL XL Take 60 mg by mouth daily.   rosuvastatin 10 MG tablet Commonly known as: CRESTOR Take 10 mg by mouth every evening.   traMADol 50 MG tablet Commonly known as: ULTRAM Take 1 tablet (50 mg total) by mouth every 6 (six) hours as needed for moderate pain.          REVIEW OF SYSTEMS: A comprehensive ROS was conducted with the patient and is negative except as per HPI    OBJECTIVE:  VS:BP 120/80 (BP Location: Left Arm, Patient Position: Sitting, Cuff Size: Large)   Pulse 68   Ht 5\' 3"  (1.6 m)   Wt 165 lb (74.8 kg)   SpO2 94%   BMI 29.23 kg/m    Wt Readings from Last 3 Encounters:  05/02/23 165 lb (74.8 kg)  04/14/23 162 lb (73.5 kg)  02/14/23 162 lb 3.2 oz (73.6 kg)     EXAM: General: Pt appears well and is in NAD  Neck: General: Supple without adenopathy. Thyroid: no  nodule appreciated  Lungs: Clear with good BS bilat with no rales, rhonchi, or wheezes  Heart: Auscultation: RRR. + Murmur   Abdomen: soft, nontender, without masses or organomegaly palpable  Extremities:  BL LE: No pretibial edema normal ROM and strength.  Mental Status: Judgment, insight: Intact Orientation: Oriented to time, place, and person Mood and affect: No depression, anxiety, or agitation     DATA REVIEWED:   Latest Reference Range & Units 05/02/23 09:26  Albumin 3.5 - 5.2 g/dL 3.6  VITD 21.30 - 865.78 ng/mL 12.12 (L)  (L): Data is abnormally low  Latest Reference Range & Units 05/02/23 09:26  TSH 0.35 - 5.50 uIU/mL 20.78 (H)  (H): Data is abnormally high    Latest Reference Range & Units 04/18/23 10:47  Sodium 135 - 145 mmol/L 138  Potassium 3.5 - 5.1  mmol/L 3.6  Chloride 98 - 111 mmol/L 100  CO2 22 - 32 mmol/L 29  Glucose 70 - 99 mg/dL 469 (H)  BUN 8 - 23 mg/dL 9  Creatinine 6.29 - 5.28 mg/dL 4.13 (H)  Calcium 8.9 - 10.3 mg/dL 9.3  Anion gap 5 - 15  9  GFR, Estimated >60 mL/min 54 (L)     DXA 10/2021  AP t-score -0.5 1/3rd distal forearm -0.9    Thyroid pathology 10/15/2022  FINAL MICROSCOPIC DIAGNOSIS:   A. 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  -  DEGENERATIVE CHANGES AND FOCAL CALCIFICATIONS PRESENT  - BIOPSY SITE CHANGES PRESENT  - NEGATIVE FOR MALIGNANCY     ASSESSMENT/PLAN/RECOMMENDATIONS:    Hyperparathyroidism:  -Patient with elevated PTH, serum calcium including ionized calcium is at the upper limit of normal in the past  -24-hour urinary calcium was normal at 179 mg in January 2023 -She had a normal DXA scan in January 2023 including a T score of -0.9 at the distal one third of the forearm -No indication for surgical intervention at this time -Most recent serum calcium has been normal   Recommendations Avoid OTC calcium tablets Stay hydrated Consume 2-3 servings of calcium daily    2. Postoperative Hypothyroidism:  -She is s/p total thyroidectomy with benign pathology on 10/15/2022 -Patient is clinically euthyroid -Pt educated extensively on the correct way to take levothyroxine (first thing in the morning with water, 30 minutes before eating or taking other medications). - Pt encouraged to double dose the following day if she were to miss a dose given long half-life of levothyroxine. -TSH remains elevated will increase the dose and encourage compliance   Medication Stop levothyroxine 125 mcg daily Start levothyroxine 150 mcg daily    3. Vitamin D deficiency  -Patient with imperfect adherence to OTC vitamin D - will start ergocalciferol 50,000 weekly   Follow-up in 6 months  Signed electronically by: Lyndle Herrlich, MD  [facility]  [facility] [address] [place] [place], [place] [address] Phone: [phone] FAX: [phone]   CC: Ellyn Hack, MD [address] [place] [address] Phone: [phone] Fax: [phone]   Return to Endocrinology clinic as below: Future Appointments  Date Time Provider Department Center  08/15/2023  8:00 AM Christell Constant, MD [facility]  11/05/2023  8:10 AM Cyan Clippinger, Konrad Dolores, MD [facility] None

## 2023-05-03 ENCOUNTER — Encounter: Payer: Self-pay | Admitting: Internal Medicine

## 2023-05-03 LAB — CALCIUM, IONIZED: Calcium, Ion: 5.5 mg/dL (ref 4.7–5.5)

## 2023-05-06 NOTE — Telephone Encounter (Signed)
Attempted to contact patient and it states that call can't go through at this time.

## 2023-05-07 NOTE — Telephone Encounter (Signed)
A letter was sent on 05/03/2023

## 2023-05-07 NOTE — Telephone Encounter (Signed)
3 attempts have been made to contact the patient with no success. Patient not on mychart.

## 2023-08-15 ENCOUNTER — Ambulatory Visit: Payer: Medicare HMO | Admitting: Internal Medicine

## 2023-09-16 ENCOUNTER — Encounter: Payer: Self-pay | Admitting: Internal Medicine

## 2023-09-16 ENCOUNTER — Ambulatory Visit: Payer: Medicare HMO | Attending: Internal Medicine | Admitting: Internal Medicine

## 2023-09-16 VITALS — BP 126/82 | HR 59 | Ht 63.0 in | Wt 152.8 lb

## 2023-09-16 DIAGNOSIS — E782 Mixed hyperlipidemia: Secondary | ICD-10-CM

## 2023-09-16 DIAGNOSIS — R072 Precordial pain: Secondary | ICD-10-CM

## 2023-09-16 DIAGNOSIS — I421 Obstructive hypertrophic cardiomyopathy: Secondary | ICD-10-CM

## 2023-09-16 MED ORDER — PREDNISONE 50 MG PO TABS: 50.0000 mg | ORAL_TABLET | ORAL | 0 refills | Status: DC

## 2023-09-16 NOTE — Progress Notes (Signed)
Cardiology Office Note:    Date:  09/16/2023   ID:  Grace Dyer, DOB [birth year], MRN [id number]  PCP:  Ellyn Hack, MD   [facility] Providers Cardiologist:  None     Referring MD: Ellyn Hack, MD   CC: HCM  History of Present Illness:    Grace Dyer is a 72 y.o. female with a hx of HCM (resting LVOT gradient 29, max thickness 19 mm, ~1% LGE using 6 standard deviation approach).   August found to have an inducible gradient of 70 mm Hg. 2023: She was asymptomatic.  At our last visit she was eating chicken wings during our office visit without post prandial symptoms.  Did not wish to start medication therapy.  Did not wish to start medication or SRT interventions. 2024: since she had her thyroid removed she is taking her metoprolol tartrate PRN more frequently.  Grace Dyer,  diagnosed with hypertrophic cardiomyopathy and hyperlipidemia, has been managed conservatively due to minimal symptoms in the past. 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 The patient's blood pressure is well controlled on current medications, and cholesterol levels are being monitored. The patient also has a history of thyroidectomy for hypothyroidism, which is managed by her primary care physician.  Over the past six months, the patient has experienced occasional chest discomfort, most notably a recent episode while standing at the sink. The discomfort was localized and subsided after rest. The patient also reported that certain smells trigger palpitations, leading to discomfort and the need to rest. These episodes have been frequent over the past six months. The patient also reported occasional lightheadedness, particularly when preparing to descend stairs, which has occurred about three times in the past six months. The patient's symptoms are exacerbated by exertion, such as extensive walking.  The patient has a history of  bradycardia, and her heart rate remains on the lower side. She has had a few instances of non-sustained ventricular tachycardia, but the frequency is relatively low. A recent CT scan revealed minor blockages, but these are not believed to be the primary cause of the patient's discomfort. The patient's symptoms are attributed to her hypertrophic obstructive cardiomyopathy. The patient expressed a desire for improved breathing and overall symptom management.  Past Medical History:  Diagnosis Date   Aortic atherosclerosis (HCC)    Cancer (HCC)    Thyroid   Dyslipidemia    Dyspnea    HCM   Heart murmur    Hypertension    Hypertrophic cardiomyopathy (HCC)    Osteoporosis    Palpitations    Sciatica    Scoliosis     Past Surgical History:  Procedure Laterality Date   ABDOMINAL HYSTERECTOMY  1997   total   HIP ARTHROPLASTY Right 2008   HIP ARTHROPLASTY Left 2013   LAPAROTOMY N/A 04/14/2023   Procedure: EXPLORATORY LAPAROTOMY with reduction of internal hernia and small bowel resection;  Surgeon: Romie Levee, MD;  Location: [facility] OR;  Service: General;  Laterality: N/A;   THYROIDECTOMY N/A 10/15/2022   Procedure: TOTAL THYROIDECTOMY;  Surgeon: Darnell Level, MD;  Location: [facility] ORS;  Service: General;  Laterality: N/A;   WISDOM TOOTH EXTRACTION     age 56    Current Medications: Current Meds  Medication Sig   ergocalciferol (VITAMIN D2) 1.25 MG (50000 UT) capsule Take 1 capsule (50,000 Units total) by mouth once a week.   fluticasone (FLONASE) 50 MCG/ACT nasal spray Place 1 spray into  both nostrils daily. Begin by using 2 sprays in each nare daily for 3 to 5 days, then decrease to 1 spray in each nare daily. (Patient taking differently: Place 1 spray into both nostrils daily as needed for allergies.)   levocetirizine (XYZAL) 5 MG tablet Take 1 tablet (5 mg total) by mouth every evening.   levothyroxine (SYNTHROID) 150 MCG tablet Take 1 tablet (150 mcg total) by mouth daily.   metoprolol  succinate (TOPROL-XL) 100 MG 24 hr tablet Take 1 tablet (100 mg total) by mouth daily.   metoprolol tartrate (LOPRESSOR) 25 MG tablet Take 1 tablet (25 mg total) by mouth 2 (two) times daily as needed for up to 30 doses (palpitations).   Multiple Vitamins-Minerals (ADULT GUMMY PO) Take 2 each by mouth daily.   NIFEdipine (PROCARDIA XL/NIFEDICAL XL) 60 MG 24 hr tablet Take 60 mg by mouth daily.   rosuvastatin (CRESTOR) 10 MG tablet Take 10 mg by mouth every evening.   traMADol (ULTRAM) 50 MG tablet Take 1 tablet (50 mg total) by mouth every 6 (six) hours as needed for moderate pain.   [DISCONTINUED] predniSONE (DELTASONE) 50 MG tablet Take 1 tablet (50 mg total) by mouth as directed. Take 1 tablet 13 hours, 7 hours and 1 hour prior to Cardiac CT     Allergies:   Codeine and Shellfish allergy   Social History   Socioeconomic History   Marital status: Widowed    Spouse name: Not on file   Number of children: Not on file   Years of education: Not on file   Highest education level: Not on file  Occupational History   Not on file  Tobacco Use   Smoking status: Former    Current packs/day: 0.00    Average packs/day: 0.3 packs/day for 20.0 years (5.0 ttl pk-yrs)    Types: Cigarettes    Start date: 2001    Quit date: 2021    Years since quitting: 3.9    Passive exposure: Never   Smokeless tobacco: Never  Vaping Use   Vaping status: Never Used  Substance and Sexual Activity   Alcohol use: Yes    Comment: rare   Drug use: Never   Sexual activity: Not on file  Other Topics Concern   Not on file  Social History Narrative   Not on file   Social Determinants of Health   Financial Resource Strain: Not on file  Food Insecurity: No Food Insecurity (10/15/2022)   Hunger Vital Sign    Worried About Running Out of Food in the Last Year: Never true    Ran Out of Food in the Last Year: Never true  Transportation Needs: No Transportation Needs (10/15/2022)   PRAPARE - Doctor, general practice (Medical): No    Lack of Transportation (Non-Medical): No  Physical Activity: Not on file  Stress: Not on file  Social Connections: Not on file    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     Cardiac Studies & Procedures     STRESS TESTS  ECHOCARDIOGRAM STRESS TEST 05/16/2022  Narrative EXERCISE STRESS ECHO REPORT   --------------------------------------------------------------------------------  Patient Name:   [facility] Date of Exam: 05/15/2022 Medical Rec #:  [id number]        Height:       63.0 in Accession #:    [id number]  Weight:       162.0 lb Date of Birth:  February 05, 1951        BSA:          1.768 m Patient Age:    71 years         BP:           152/73 mmHg Patient Gender: F                HR:           63 bpm. Exam Location:  [facility]  Procedure: Stress Echo, Limited Color Doppler and Cardiac Doppler  Indications:    I42.2 Hypertrophic cardiomyopathy Evaluation of the HOCM gradient post exercise.  History:        Patient has no prior history of Echocardiogram examinations. Echo performed elsewhere on Feb 15, 2022 demonstrated severe asymmetric septal hypertrophic cardiomyopathy-LVEF 70% HOCM at rest 29 mmHg and SAM. septal diameter of 1.9 cm.  Sonographer:    Chanetta Marshall [facility], RDCS Referring Phys: [id number] [facility] A Ajayla Iglesias  IMPRESSIONS   1. This is an inconclusive stress echocardiogram for ischemia. 2. This is an indeterminate risk study for ischemia (study done for inducivle LVOT gradient). 3. At rest, hypderynamie LV with LVEF 70%. Septal thickness 26 mm. 4. There is resting systolic anterior motion of the mitral valve and associated mitral regurgitation. There is notable contimation of the LVOT gradient by mitral valve signal 5. Back calculation of gradient using mitral regurgitaiton method shows a resting gradient of 29 mm Hg  but an inducible gradient of 90 mm Hg. 6. 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 7. Trivial pericardial effusion without tamponade. 8. Left Atrial dilation.  FINDINGS  Exam Protocol:   Patient Performance: The heart rate at peak stress was 125 bpm. The target heart rate was calculated to be 127 bpm. The percentage of maximum predicted heart rate achieved was 83.9 %. The baseline blood pressure was 135/69 mmHg. The blood pressure at peak stress was 135/53 mmHg. The patient developed fatigue during the stress exam.  EKG: Resting EKG showed normal sinus rhythm. The patient developed no abnormal EKG findings during exercise.   2D Echo Findings: The baseline ejection fraction was 70%. Baseline regional wall motion abnormalities were not present. This is an inconclusive stress echocardiogram for ischemia.   Riley Lam MD Electronically signed on 05/16/2022 at 10:50:36 AM     Final     MONITORS  LONG TERM MONITOR (3-14 DAYS) 03/12/2023  Narrative   Patient had a minimum heart rate of 41 bpm, maximum heart rate of 179 bpm, and average heart rate of 57 bpm.   Predominant underlying rhythm was sinus rhythm.   Two short runs of NSVT lasting 7 beats at longest.   Short runs of atrial tachycardia, longest 19 beats.   Isolated PACs were rare (<1.0%).   Isolated PVCs were rare (<1.0%).   Triggered and diary events associated with sinus rhythm PACs or PVCs  Rare NSVT in the setting of HCM.    CARDIAC MRI  MR CARDIAC MORPHOLOGY W WO CONTRAST 04/19/2022  Narrative CLINICAL DATA:  Hypertrophic Cardiomyopathy  EXAM: CARDIAC MRI  TECHNIQUE: The patient was scanned on a 1.5 Tesla Siemens magnet. A dedicated cardiac coil was used. Functional imaging was done using Fiesta sequences. 2,3, and 4 chamber views were done to assess for RWMA's. Modified Simpson's rule using a short axis stack was used to calculate an ejection fraction on a dedicated work station  using [employer]. The patient received 9 cc of Gadavist. After 10 minutes inversion recovery sequences were used to assess for infiltration and scar tissue.  CONTRAST:  Gadavist  FINDINGS: Severe LAE. Normal RA. No ASD/PFO. Normal ascending thoracic aorta 3.4 cm. Small to moderate circumferential pericardial effusion Mildly thickened MV with SAM and mild/moderate appearing MR. Normal TV/PV. Normal tri leaflet AV Severe asymmetrical septal hypertrophy 19 mm with lateral wall 9 mm Quantitative EF 72% (EDV 132 cc ESV 37 cc SV 95 cc) Normal RV size and function  T2 normal 51 msec  T1 global mildly increased 1076 msec  ECV: Diffusely elevated > 55%  Delayed gadolinium images show diffuse mid/sub epicardial uptake including regions extending beyond the hypertrophied septum  IMPRESSION: 1. Morphologic findings consistent with hypertrophic cardiomyopathy with asymmetric septal hypertrophy 19 mm, SAM with LVOT turbulence and MR.  2. Diffuse mid/subepicardial gadolinium uptake and diffusely elevated T1/ECV suggest possibility of amyloid masquerading as HOCM  3.  Severe LAE  4.  Mild/Moderate circumferential pericardial effusion  5. Suggest f/u Echo with strain imaging and Tc Pyrophosphate scan to further investigate  Charlton Haws   Electronically Signed By: Charlton Haws M.D. On: 04/19/2022 17:54           EKGs/Labs/Other Studies Reviewed:    Recent Labs: 04/14/2023: ALT 24 04/17/2023: Magnesium 1.9 04/18/2023: BUN 9; Creatinine, Ser 1.09; Potassium 3.6; Sodium 138 04/19/2023: Hemoglobin 9.0; Platelets 174 05/02/2023: TSH 20.78  Recent Lipid Panel No results found for: "CHOL", "TRIG", "HDL", "CHOLHDL", "VLDL", "LDLCALC", "LDLDIRECT"      Physical Exam:    VS:  BP 126/82   Pulse (!) 59   Ht 5\' 3"  (1.6 m)   Wt 152 lb 12.8 oz (69.3 kg)   SpO2 97%   BMI 27.07 kg/m     Wt Readings from Last 3 Encounters:  09/16/23 152 lb 12.8 oz (69.3 kg)  05/02/23 165 lb  (74.8 kg)  04/14/23 162 lb (73.5 kg)    GEN:  Well nourished, well developed in no acute distress HEENT: Normal NECK: No JVD CARDIAC: regular bradycardia with systolic murmur with hand grip, no rubs, gallops  RESPIRATORY:  Clear to auscultation without rales, wheezing or rhonchi  ABDOMEN: Soft, non-tender, non-distended MUSCULOSKELETAL:  No edema; No deformity  SKIN: Warm and dry NEUROLOGIC:  Alert and oriented x 3 PSYCHIATRIC:  Normal affect   ASSESSMENT:    1. Precordial pain   2. Mixed hyperlipidemia   3. Hypertrophic obstructive cardiomyopathy (HOCM) (HCC)     PLAN:    Hypertrophic Obstructive Cardiomyopathy (HOCM)   - Septal Variant - Inducible gradient of 90 mm Hg - NYHA II Known HOCM with inducible gradient of 90 mmHg and NYHA class II. Minimally symptomatic with occasional chest pain and palpitations triggered by certain smells. Conservative management preferred. Discussed treatment options: cardiac myosin inhibitors (benefits: symptom improvement, delay in surgery; risks: medication interactions, need for close monitoring), alcohol septal ablation (benefits: one-time procedure; risks: bleeding, stroke, pacemaker requirement), and myectomy (benefits: one-time surgery; risks: stroke 5%, pacemaker 5%, prolonged recovery). She prefers to discuss options with family before deciding.   - Provide educational materials on HOCM for patient and family   - Follow up in one week to discuss treatment decision   - Hold off on ordering cardiac CT until treatment decision is made - CMR notable thickness, minimal LGE using 6 standard deviation method - Repeat heart monitor next summer  for NSVT, well controlled on bradycardia - Family history with no  prior HCM or SCD, Discussed family screening  (Genetic eval 02/2023; eldest son screened; two daughters and youngest son not screened)  Non obstructive CAD Hyperlipidemia  with aortic atherosclerosis - Check cholesterol levels today   - if  she chooses [facility] 2024, [facility] 2025) we will have her come back for paperwork and order a CCTA for CAD evaluation - if she chooses myectomy ([facility]) will get TEE and LHC for assessments   Hypertension   - Continue current antihypertensive medications    Hypothyroidism   Follows up with primary care doctor for hypothyroidism post-thyroidectomy.    General Health Maintenance   Discussed the importance of screening for hypertrophic cardiomyopathy in her children.   - Provide summary and resources for hypertrophic cardiomyopathy screening for her children    Follow-up   - Follow up in six months   - Call next week to discuss treatment decision.  .Time Spent Directly with Patient:   I have spent a total of 47 minutes with the patient reviewing notes, imaging, EKGs, labs,  and examining the patient as well as establishing an assessment and plan that was discussed personally with the patient. .Discussed disease state education , using shared decision making tools and cardiac modeling   Medication Adjustments/Labs and Tests Ordered: Current medicines are reviewed at length with the patient today.  Concerns regarding medicines are outlined above.  Orders Placed This Encounter  Procedures   Lipid panel   ALT   Meds ordered this encounter  Medications   DISCONTD: predniSONE (DELTASONE) 50 MG tablet    Sig: Take 1 tablet (50 mg total) by mouth as directed. Take 1 tablet 13 hours, 7 hours and 1 hour prior to Cardiac CT    Dispense:  3 tablet    Refill:  0    Patient Instructions  Medication Instructions:  Your physician recommends that you continue on your current medications as directed. Please refer to the Current Medication list given to you today.  *If you need a refill on your cardiac medications before your next appointment, please call your pharmacy*   Lab Work: FLP, ALT If you have labs (blood work) drawn today and your tests are completely normal, you will receive your  results only by: MyChart Message (if you have MyChart) OR A paper copy in the mail If you have any lab test that is abnormal or we need to change your treatment, we will call you to review the results.   Testing/Procedures: NONE     Follow-Up: At [facility], you and your health needs are our priority.  As part of our continuing mission to provide you with exceptional heart care, we have created designated Provider Care Teams.  These Care Teams include your primary Cardiologist (physician) and Advanced Practice Providers (APPs -  Physician Assistants and Nurse Practitioners) who all work together to provide you with the care you need, when you need it.  We recommend signing up for the patient portal called "MyChart".  Sign up information is provided on this After Visit Summary.  MyChart is used to connect with patients for Virtual Visits (Telemedicine).  Patients are able to view lab/test results, encounter notes, upcoming appointments, etc.  Non-urgent messages can be sent to your provider as well.   To learn more about what you can do with MyChart, go to [email or web address].    Your next appointment:   6 month(s)  Provider:   Riley Lam, MD   Other Instructions  Signed, Christell Constant, MD  09/16/2023 10:12 AM    [facility]

## 2023-09-16 NOTE — Patient Instructions (Addendum)
Medication Instructions:  Your physician recommends that you continue on your current medications as directed. Please refer to the Current Medication list given to you today.  *If you need a refill on your cardiac medications before your next appointment, please call your pharmacy*   Lab Work: FLP, ALT If you have labs (blood work) drawn today and your tests are completely normal, you will receive your results only by: MyChart Message (if you have MyChart) OR A paper copy in the mail If you have any lab test that is abnormal or we need to change your treatment, we will call you to review the results.   Testing/Procedures: NONE     Follow-Up: At [facility], you and your health needs are our priority.  As part of our continuing mission to provide you with exceptional heart care, we have created designated Provider Care Teams.  These Care Teams include your primary Cardiologist (physician) and Advanced Practice Providers (APPs -  Physician Assistants and Nurse Practitioners) who all work together to provide you with the care you need, when you need it.  We recommend signing up for the patient portal called "MyChart".  Sign up information is provided on this After Visit Summary.  MyChart is used to connect with patients for Virtual Visits (Telemedicine).  Patients are able to view lab/test results, encounter notes, upcoming appointments, etc.  Non-urgent messages can be sent to your provider as well.   To learn more about what you can do with MyChart, go to [email or web address].    Your next appointment:   6 month(s)  Provider:   Riley Lam, MD   Other Instructions

## 2023-09-17 LAB — LIPID PANEL
Chol/HDL Ratio: 1.7 {ratio} (ref 0.0–4.4)
Cholesterol, Total: 159 mg/dL (ref 100–199)
HDL: 93 mg/dL (ref >39)
LDL Chol Calc (NIH): 55 mg/dL (ref 0–99)
Triglycerides: 50 mg/dL (ref 0–149)
VLDL Cholesterol Cal: 11 mg/dL (ref 5–40)

## 2023-09-17 LAB — ALT: ALT: 18 [IU]/L (ref 0–32)

## 2023-09-20 ENCOUNTER — Telehealth: Payer: Self-pay | Admitting: *Deleted

## 2023-09-20 NOTE — Telephone Encounter (Signed)
Called Grace Dyer to follow-up on the possible participation in the Shared Decision Support Intevention project that Dr. Izora Ribas discussed with her earlier this week. No answer and voicemail was not available.

## 2023-09-23 ENCOUNTER — Encounter: Payer: Self-pay | Admitting: Internal Medicine

## 2023-10-11 ENCOUNTER — Telehealth: Payer: Self-pay | Admitting: Internal Medicine

## 2023-10-11 NOTE — Telephone Encounter (Signed)
 Pt was returning nurse call regarding results after receiving a letter and is requesting a callback at [phone]. Please advise

## 2023-10-11 NOTE — Telephone Encounter (Signed)
 The patient has been notified of the result and verbalized understanding.  All questions (if any) were answered. Macie Burows, RN 10/11/2023 11:06 AM

## 2023-11-05 ENCOUNTER — Ambulatory Visit: Payer: Medicare Other | Admitting: Internal Medicine

## 2023-11-08 ENCOUNTER — Emergency Department (HOSPITAL_COMMUNITY)
Admission: EM | Admit: 2023-11-08 | Discharge: 2023-11-08 | Disposition: A | Discharge status: Home / Self Care | Payer: Medicare Other | Attending: Emergency Medicine | Admitting: Emergency Medicine

## 2023-11-08 ENCOUNTER — Emergency Department (HOSPITAL_COMMUNITY): Payer: Medicare Other

## 2023-11-08 ENCOUNTER — Other Ambulatory Visit: Payer: Self-pay

## 2023-11-08 DIAGNOSIS — M48061 Spinal stenosis, lumbar region without neurogenic claudication: Secondary | ICD-10-CM | POA: Diagnosis not present

## 2023-11-08 DIAGNOSIS — Z96643 Presence of artificial hip joint, bilateral: Secondary | ICD-10-CM | POA: Diagnosis not present

## 2023-11-08 DIAGNOSIS — M25551 Pain in right hip: Secondary | ICD-10-CM | POA: Diagnosis present

## 2023-11-08 MED ORDER — HYDROCODONE-ACETAMINOPHEN 5-325 MG PO TABS
1.0000 | ORAL_TABLET | Freq: Once | ORAL | Status: AC
  Administered 2023-11-08: 1 via ORAL
  Filled 2023-11-08: qty 1

## 2023-11-08 NOTE — ED Notes (Signed)
 Patient Alert and oriented to baseline. Stable and ambulatory to baseline. Patient verbalized understanding of the discharge instructions.  Patient belongings were taken by the patient.

## 2023-11-08 NOTE — ED Provider Triage Note (Signed)
Emergency Medicine Provider Triage Evaluation Note  Grace Dyer , a 73 y.o. female  was evaluated in triage.  Pt complains of new right hip pain and right low back pain for the last week after bending down and hearing a loud pop.  Review of Systems  Positive: Weakness in right leg and dragging her leg, significant pain in her right hip and right low back rating down her right leg. Negative: No numbness, no left leg symptoms, no urinary changes, no fevers or chills.  No loss of control of bowel or bladder.  Physical Exam  There were no vitals taken for this visit. Gen:   Awake, no distress  , pain in low back and right hip area Resp:  Normal effort , lungs clear and chest nontender.  Abdomen nontender MSK:   Difficulty raising her right leg.  Straight leg raise positive on the right side.  Tenderness in right lumbar area and right hip.  Intact sensation and pulses distally.  Good strength of the foot itself. Other:  Mid back tenderness chest tenderness or abdominal tenderness  Medical Decision Making  Medically screening exam initiated at 10:36 AM.  Appropriate orders placed.  Grace Dyer was informed that the remainder of the evaluation will be completed by another provider, this initial triage assessment does not replace that evaluation, and the importance of remaining in the ED until their evaluation is complete.  Grace Dyer is a 73 y.o. female with a past medical history significant for hypertension, dyslipidemia, osteoporosis, hypertrophic cardiomyopathy, and scoliosis with a history of previous bilateral hip replacements who presents with 1 week of significant pain in her right hip and right low back radiating down her right leg.  She reports that while bending over to get something last week she felt a pop and immediately had onset of pain in her right low back and right hip..  She has never had pain like this before.  She reports has been dragging her leg ever since and feels  that her right leg is weak.  She reports she cannot lift it up due to weakness and pain.  She denies any numbness.  Denies any loss of bowel or bladder control and has no history of low back surgery or low back injury.  She reports no fevers or chills, denies nausea or vomiting constipation or diarrhea.  No symptoms on the left side or the left leg.  On exam, lungs clear.  Chest nontender.  Abdomen nontender.  Tenderness present in the lumbar spine area in the right SI area.  Tenderness also in the right hip.  Pain with straight leg raise and the pain did shoot down the right leg.  She had good pulses and strength in the foot.  Good sensation distally.  Patient did not have pain with manipulation laterally of her right leg.  Pelvis otherwise nontender on the front side.  Clinically I suspect she may have pulled something when bending over however given her osteoporosis and this new significant pain in her low back going down her right leg with foot dragging and weakness, will get MRI of the lumbar spine to rule out cauda equina or lumbar injury and also get x-ray of the right hip.  Anticipate reassessment by provider when she gets back to room after workup.      Grace Dyer, Canary Brim, MD 11/08/23 1052

## 2023-11-08 NOTE — ED Triage Notes (Signed)
Pt. Stated, I bent over wrong a week ago and having right hip pain. Ive had a hip replacement 2008

## 2023-11-08 NOTE — ED Provider Notes (Signed)
[place] EMERGENCY DEPARTMENT AT [facility] Provider Note   CSN: [id number] Arrival date & time: 11/08/23  4010     History  Chief Complaint  Patient presents with   Hip Pain    Grace Dyer is a 73 y.o. female history of hyperparathyroidism, SVT, PVCs, status post bilateral hip replacements presented with right hip pain for the past week after bending the wrong way.  Patient is concerned that she dislocated her hip.  Patient be able to walk at baseline with her cane.  Patient denies saddle anesthesia or new onset weakness or urinary or bowel incontinence or fevers.  Home Medications Prior to Admission medications   Medication Sig Start Date End Date Taking? Authorizing Provider  ergocalciferol (VITAMIN D2) 1.25 MG (50000 UT) capsule Take 1 capsule (50,000 Units total) by mouth once a week. 05/02/23   Shamleffer, Konrad Dolores, MD  fluticasone (FLONASE) 50 MCG/ACT nasal spray Place 1 spray into both nostrils daily. Begin by using 2 sprays in each nare daily for 3 to 5 days, then decrease to 1 spray in each nare daily. Patient taking differently: Place 1 spray into both nostrils daily as needed for allergies. 12/28/22   Theadora Rama Scales, PA-C  levocetirizine (XYZAL) 5 MG tablet Take 1 tablet (5 mg total) by mouth every evening. 12/28/22 09/16/23  Theadora Rama Scales, PA-C  levothyroxine (SYNTHROID) 150 MCG tablet Take 1 tablet (150 mcg total) by mouth daily. 05/02/23   Shamleffer, Konrad Dolores, MD  metoprolol succinate (TOPROL-XL) 100 MG 24 hr tablet Take 1 tablet (100 mg total) by mouth daily. 02/26/22   Hilty, Lisette Abu, MD  metoprolol tartrate (LOPRESSOR) 25 MG tablet Take 1 tablet (25 mg total) by mouth 2 (two) times daily as needed for up to 30 doses (palpitations). 06/06/22   Curatolo, Adam, DO  Multiple Vitamins-Minerals (ADULT GUMMY PO) Take 2 each by mouth daily.    [provider]  NIFEdipine (PROCARDIA XL/NIFEDICAL XL) 60 MG 24 hr tablet Take 60  mg by mouth daily. 08/31/22   [provider]  rosuvastatin (CRESTOR) 10 MG tablet Take 10 mg by mouth every evening. 10/24/21   [provider]  traMADol (ULTRAM) 50 MG tablet Take 1 tablet (50 mg total) by mouth every 6 (six) hours as needed for moderate pain. 04/21/23   Darnell Level, MD      Allergies    Codeine and Shellfish allergy    Review of Systems   Review of Systems  Physical Exam Updated Vital Signs There were no vitals taken for this visit. Physical Exam Constitutional:      General: She is not in acute distress. Cardiovascular:     Rate and Rhythm: Normal rate.     Pulses: Normal pulses.  Musculoskeletal:     Comments: Tenderness to right hip without bony abnormalities Stable pelvis 4 out of 5 right hip flexion, 5 5 left hip flexion No midline tenderness or abnormalities palpated Positive straight leg test on the right side  Skin:    General: Skin is warm and dry.     Capillary Refill: Capillary refill takes less than 2 seconds.  Neurological:     Mental Status: She is alert.     Comments: Sensation intact distally Able to ambulate without difficulty with walker does endorse pain when ambulating  Psychiatric:        Mood and Affect: Mood normal.     ED Results / Procedures / Treatments   Labs (all labs ordered are  listed, but only abnormal results are displayed) Labs Reviewed - No data to display  EKG None  Radiology MR LUMBAR SPINE WO CONTRAST Result Date: 11/08/2023 CLINICAL DATA:  Low back pain with cauda equina syndrome suspected EXAM: MRI LUMBAR SPINE WITHOUT CONTRAST TECHNIQUE: Multiplanar, multisequence MR imaging of the lumbar spine was performed. No intravenous contrast was administered. COMPARISON:  12/06/2021 FINDINGS: Segmentation: Transitional lumbosacral vertebra numbered S1 based on comparison. Alignment:  L5-S1 anterolisthesis measuring 9 mm. Vertebrae:  No fracture, evidence of discitis, or bone lesion. Conus medullaris  and cauda equina: Conus extends to the L2-3 level. Conus appears normal. There is cauda equina redundancy beneath the severe spinal stenosis. Paraspinal and other soft tissues: No perispinal mass or inflammation. Disc levels: T12- L1: Unremarkable L1-L2: Disc narrowing and bulging with endplate degeneration. Mild facet spurring. L2-L3: Disc narrowing and bulging with mild facet spurring. L3-L4: Disc narrowing and bulging. Mild facet spurring. L4-L5: Moderate degenerative facet spurring. Mild circumferential disc bulging. L5-S1:Severe facet osteoarthritis with bulky spurring and anterolisthesis. Posterior synovial cysts and inter spinous degenerative ganglia. Severe spinal stenosis and biforaminal impingement. S1-2: Disc collapse and intervertebral ankylosis with collapse and spurring causing right foraminal impingement. IMPRESSION: 1. Transitional lumbosacral vertebra numbered S1. 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 2. Focal severe spinal stenosis at L5-S1 with left more than right foraminal impingement. 3. S1-2 chronic right foraminal impingement. 4. These findings are stable since a march 2023 comparison. Electronically Signed   By: Tiburcio Pea M.D.   On: 11/08/2023 12:30   DG Hip Unilat W or Wo Pelvis 2-3 Views Right Result Date: 11/08/2023 CLINICAL DATA:  Pop in right hip after standing from chair. No known injury. EXAM: DG HIP (WITH OR WITHOUT PELVIS) 3V RIGHT COMPARISON:  Abdominal radiograph dated 04/17/2023, CTA abdomen and pelvis dated 04/14/2023 FINDINGS: Bilateral hip arthroplasties. There is no evidence of hip fracture or dislocation. Degenerative changes of the lower lumbar spine and bilateral sacroiliac joints. Surgical sutures project over the left lower quadrant. IMPRESSION: Bilateral hip arthroplasties. No acute displaced fracture or dislocation. Electronically Signed   By: Agustin Cree M.D.   On: 11/08/2023 11:18    Procedures Procedures    Medications Ordered in ED Medications   HYDROcodone-acetaminophen (NORCO/VICODIN) 5-325 MG per tablet 1 tablet (has no administration in time range)    ED Course/ Medical Decision Making/ A&P                                 Medical Decision Making Risk Prescription drug management.   Eveline Keto 72 y.o. presented today for back pain. Working DDx that I considered at this time includes, but not limited to, MSK, underlying fracture, epidural hematoma/abscess, cauda equina syndrome, spinal stenosis, spinal malignancy, discitis, spinal infection, spondylitises/ spondylosis, conus medullaris, DDD of the back.  R/o DDx: MSK, underlying fracture, epidural hematoma/abscess, cauda equina syndrome, spinal malignancy, discitis, spinal infection, spondylitises/ spondylosis, conus medullaris, DDD of the back : less likely due to history of present illness, physical exam, labs/imaging findings.  Review of prior external notes: 09/16/2023 office visit  Unique Tests and My Interpretation:  MRI lumbar spine without contrast: Severe stenosis in L5-S1 mostly in the left side; stable findings Right hip x-ray: No acute findings  Social Determinants of Health: none  Discussion with Independent Historian: None  Discussion of Management of Tests: None  Risk: Medium: prescription drug management  Risk Stratification Score: none  Staffed with Renaye Rakers, MD  Plan: On exam patient was no acute distress with stable vitals.  Patient can walk but states is painful.  Patient is walking at her baseline and she normally needs a cane.  No loss of bowel or bladder control.  No concern for cauda equina.  No fever, night sweats, weight loss, h/o cancer, IVDU.  Imaging from triage does show significant stenosis in the L5-S1 region however patient did not endorsing any red flag symptoms and is able to ambulate at this time.  I spoke to the patient about following up outpatient with a neurosurgeon for further management patient verbalized her agreement  with this and so we will give 1 round of pain meds here and have her follow-up with neurosurgery.  Patient is on tramadol already at home and I encouraged her to continue using her medications as prescribed.  Patient was given return precautions. Patient stable for discharge at this time.  Patient verbalized understanding of plan.  This chart was dictated using voice recognition software.  Despite best efforts to proofread,  errors can occur which can change the documentation meaning.         Final Clinical Impression(s) / ED Diagnoses Final diagnoses:  Spinal stenosis at L4-L5 level    Rx / DC Orders ED Discharge Orders     None         Remi Deter 11/08/23 1445    Terald Sleeper, MD 11/08/23 1515

## 2023-11-08 NOTE — Discharge Instructions (Signed)
Please follow-up with the neurosurgeon I have attached your for you today.  It appears from imaging of severe stenosis in your L4-L5 will need a follow-up with them for further management.  Please continue take your tramadol at home as prescribed.  Please return to the ER if your pain becomes excruciating or you start developing new numbness, weakness, urinary incontinence (peeing on self without warning), urinary retention (not able to pee despite bladder feeling full), inability to defecate, pins and needles sensation by your ano-genital area.

## 2023-12-17 ENCOUNTER — Ambulatory Visit: Admitting: Internal Medicine

## 2023-12-17 NOTE — Progress Notes (Deleted)
 Name: Grace Dyer  MRN/ DOB: [id number], 02/22/1951    Age/ Sex: 73 y.o., female    PCP: Ellyn Hack, MD   Reason for Endocrinology Evaluation: MNG/ Hypercalcemia      Date of Initial Endocrinology Evaluation: 01/04/2022    HPI: Grace Dyer is a 73 y.o. female with a past medical history of HTN, cardiomyopathy,SVT and Dyslipidemia . The patient presented for initial endocrinology clinic visit on 01/04/2022 for consultative assistance with her Hypercalcemia .   Grace Dyer indicates that she was first diagnosed with hypercalcemia in 2023.   She has no prior history of lithium, or HCTZ use  She denies  history of kidney stones, kidney disease, liver disease, granulomatous disease. She has osteoporosis but no  prior fractures.  She denies  family history of osteoporosis, parathyroid disease, thyroid disease.   She has Hx of MNG , was seen by ENT while living in [place] years ago, no hx of FNa's  in the past  Has occasional local neck swelling   24-hour urine collection for calcium 179 mg on 10/25/2021 DXA 10/2021- normal AP T-score -0.5, 1/3rd forearm -0.9    On her initial visit to our clinic she was noted with low vitamin D, she was started on a OTC vitamin D3 2000 IU daily     THYROID HISTORY: She was diagnosed with multinodular goiter on thyroid ultrasound 04/2022.  She is s/p right mid nodule FNA 05/11/2022 with atypia of undetermined significance (Bethesda categoryIII), with benign Afirma.  She is s/p FNA of the right inferior nodule 05/11/2022 with atypia of undetermined significance (Bethesda category III) with Afirma suspicious   She is s/p total thyroidectomy 10/15/2022 with benign pathology  She was started on LT-for replacement  SUBJECTIVE:     Today (12/17/23): Grace Dyer on hypercalcemia, hyperparathyroidism, and postoperative hypothyroid   Total thyroidectomy 10/2022 benign pathology  She is s/p exploratory laparotomy with  reduction of internal hernia and small bowel resection 04/2023, she is healing but has low appetite with incremental oral intake  Denies constipation or diarrhea  Denies fever  She has been out of vitamin D for ~ 2 weeks    Er IU daily Levothyroxine 150 mcg daily   HISTORY:  Past Medical History:  Past Medical History:  Diagnosis Date   Aortic atherosclerosis (HCC)    Cancer (HCC)    Thyroid   Dyslipidemia    Dyspnea    HCM   Heart murmur    Hypertension    Hypertrophic cardiomyopathy (HCC)    Osteoporosis    Palpitations    Sciatica    Scoliosis    Past Surgical History:   Social History:  reports that she quit smoking about 4 years ago. Her smoking use included cigarettes. She started smoking about 24 years ago. She has a 5 pack-year smoking history. She has never been exposed to tobacco smoke. She has never used smokeless tobacco. She reports current alcohol use. She reports that she does not use drugs. Family History: family history is not on file.   HOME MEDICATIONS: Allergies as of 12/17/2023       Reactions   Codeine Other (See Comments)   Dizziness   Shellfish Allergy Nausea And Vomiting   Specifically shrimp and scallops        Medication List        Accurate as of December 17, 2023  8:29 AM. If you have any questions, ask your nurse or  doctor.          ADULT GUMMY PO Take 2 each by mouth daily.   ergocalciferol 1.25 MG (50000 UT) capsule Commonly known as: VITAMIN D2 Take 1 capsule (50,000 Units total) by mouth once a week.   fluticasone 50 MCG/ACT nasal spray Commonly known as: FLONASE Place 1 spray into both nostrils daily. Begin by using 2 sprays in each nare daily for 3 to 5 days, then decrease to 1 spray in each nare daily. What changed:  when to take this reasons to take this additional instructions   levocetirizine 5 MG tablet Commonly known as: XYZAL Take 1 tablet (5 mg total) by mouth every evening.   levothyroxine 150 MCG  tablet Commonly known as: SYNTHROID Take 1 tablet (150 mcg total) by mouth daily.   metoprolol succinate 100 MG 24 hr tablet Commonly known as: TOPROL-XL Take 1 tablet (100 mg total) by mouth daily.   metoprolol tartrate 25 MG tablet Commonly known as: LOPRESSOR Take 1 tablet (25 mg total) by mouth 2 (two) times daily as needed for up to 30 doses (palpitations).   NIFEdipine 60 MG 24 hr tablet Commonly known as: PROCARDIA XL/NIFEDICAL XL Take 60 mg by mouth daily.   rosuvastatin 10 MG tablet Commonly known as: CRESTOR Take 10 mg by mouth every evening.   traMADol 50 MG tablet Commonly known as: ULTRAM Take 1 tablet (50 mg total) by mouth every 6 (six) hours as needed for moderate pain.          REVIEW OF SYSTEMS: A comprehensive ROS was conducted with the patient and is negative except as per HPI    OBJECTIVE:  ZO:XWRUE were no vitals taken for this visit.   Wt Readings from Last 3 Encounters:  09/16/23 152 lb 12.8 oz (69.3 kg)  05/02/23 165 lb (74.8 kg)  04/14/23 162 lb (73.5 kg)     EXAM: General: Pt appears well and is in NAD  Neck: General: Supple without adenopathy. Thyroid: no  nodule appreciated  Lungs: Clear with good BS bilat with no rales, rhonchi, or wheezes  Heart: Auscultation: RRR. + Murmur   Abdomen: soft, nontender, without masses or organomegaly palpable  Extremities:  BL LE: No pretibial edema normal ROM and strength.  Mental Status: Judgment, insight: Intact Orientation: Oriented to time, place, and person Mood and affect: No depression, anxiety, or agitation     DATA REVIEWED:   Latest Reference Range & Units 05/02/23 09:26  Albumin 3.5 - 5.2 g/dL 3.6  VITD 45.40 - 981.19 ng/mL 12.12 (L)  (L): Data is abnormally low  Latest Reference Range & Units 05/02/23 09:26  TSH 0.35 - 5.50 uIU/mL 20.78 (H)  (H): Data is abnormally high    Latest Reference Range & Units 04/18/23 10:47  Sodium 135 - 145 mmol/L 138  Potassium 3.5 - 5.1  mmol/L 3.6  Chloride 98 - 111 mmol/L 100  CO2 22 - 32 mmol/L 29  Glucose 70 - 99 mg/dL 147 (H)  BUN 8 - 23 mg/dL 9  Creatinine 8.29 - 5.62 mg/dL 1.30 (H)  Calcium 8.9 - 10.3 mg/dL 9.3  Anion gap 5 - 15  9  GFR, Estimated >60 mL/min 54 (L)     DXA 10/2021  AP t-score -0.5 1/3rd distal forearm -0.9    Thyroid pathology 10/15/2022  FINAL MICROSCOPIC DIAGNOSIS:   A. 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 Postoperative Hypothyroidism:  -She is s/p total thyroidectomy with benign pathology on 10/15/2022 -Patient is clinically euthyroid -Pt educated extensively on the correct way to take levothyroxine (first thing in the morning with water, 30 minutes before eating or taking other medications). - Pt encouraged to double dose the following day if she were to miss a dose given long half-life of levothyroxine. -TSH remains elevated will increase the dose and encourage compliance   Medication   levothyroxine 150 mcg daily    3. Vitamin D deficiency  -Patient with imperfect adherence to OTC vitamin D - will start ergocalciferol 50,000 weekly   Dyer in 6 months  Signed electronically by: Lyndle Herrlich, MD  [facility]  [facility] [address] [place] [place], [place] [address] Phone:  [phone] FAX: [phone]   CC: Ellyn Hack, MD [address] [place] [address] Phone: [phone] Fax: [phone]   Return to Endocrinology clinic as below: Future Appointments  Date Time Provider Department Center  12/17/2023  2:00 PM Keller Bounds, Konrad Dolores, MD [facility] None  04/02/2024  9:20 AM Christell Constant, MD [facility]

## 2024-01-21 ENCOUNTER — Ambulatory Visit: Admitting: Internal Medicine

## 2024-02-06 LAB — LAB REPORT - SCANNED: HM Hepatitis Screen: NEGATIVE

## 2024-02-27 ENCOUNTER — Other Ambulatory Visit: Payer: Self-pay | Admitting: Internal Medicine

## 2024-03-10 ENCOUNTER — Ambulatory Visit (INDEPENDENT_AMBULATORY_CARE_PROVIDER_SITE_OTHER): Admitting: Internal Medicine

## 2024-03-10 ENCOUNTER — Telehealth: Payer: Self-pay | Admitting: Internal Medicine

## 2024-03-10 ENCOUNTER — Encounter: Payer: Self-pay | Admitting: Internal Medicine

## 2024-03-10 VITALS — BP 120/70 | HR 53 | Ht 63.0 in | Wt 150.0 lb

## 2024-03-10 DIAGNOSIS — E213 Hyperparathyroidism, unspecified: Secondary | ICD-10-CM | POA: Diagnosis not present

## 2024-03-10 DIAGNOSIS — E559 Vitamin D deficiency, unspecified: Secondary | ICD-10-CM

## 2024-03-10 DIAGNOSIS — E890 Postprocedural hypothyroidism: Secondary | ICD-10-CM | POA: Diagnosis not present

## 2024-03-10 NOTE — Telephone Encounter (Signed)
 Spoke with [facility] and they will send a message to the clinical team to fax lab results over.

## 2024-03-10 NOTE — Telephone Encounter (Signed)
 Can you please contact her PCPs office and ask for the latest lab results and the last office visit note   The patient says her doctor decrease levothyroxine  from 150 mcg to 125 mcg and she does is not sure why at this point    Thanks

## 2024-03-10 NOTE — Progress Notes (Unsigned)
 Name: Grace Dyer  MRN/ DOB: [id number], Mar 02, 1951    Age/ Sex: 73 y.o., female    PCP: Ulysees Gander, MD   Reason for Endocrinology Evaluation: MNG/ Hypercalcemia      Date of Initial Endocrinology Evaluation: 01/04/2022    HPI: Grace Dyer is a 73 y.o. female with a past medical history of HTN, cardiomyopathy,SVT and Dyslipidemia . The patient presented for initial endocrinology clinic visit on 01/04/2022 for consultative assistance with her Hypercalcemia .   Grace Dyer indicates that she was first diagnosed with hypercalcemia in 2023.   She has no prior history of lithium, or HCTZ use  She denies  history of kidney stones, kidney disease, liver disease, granulomatous disease. She has osteoporosis but no  prior fractures.  She denies  family history of osteoporosis, parathyroid  disease, thyroid  disease.   She has Hx of MNG , was seen by ENT while living in [place] years ago, no hx of FNa's  in the past  Has occasional local neck swelling   24-hour urine collection for calcium  179 mg on 10/25/2021 DXA 10/2021- normal AP T-score -0.5, 1/3rd forearm -0.9    On her initial visit to our clinic she was noted with low vitamin D , she was started on a OTC vitamin D3 2000 IU daily     THYROID  HISTORY: She was diagnosed with multinodular goiter on thyroid  ultrasound 04/2022.  She is s/p right mid nodule FNA 05/11/2022 with atypia of undetermined significance (Bethesda categoryIII), with benign Afirma.  She is s/p FNA of the right inferior nodule 05/11/2022 with atypia of undetermined significance (Bethesda category III) with Afirma suspicious   She is s/p total thyroidectomy 10/15/2022 with benign pathology  She was started on LT-for replacement  SUBJECTIVE:     Today (03/10/24): Grace Dyer is here for follow-up on hypercalcemia, hyperparathyroidism, and postoperative hypothyroid   Weight has been stable over the past 6 months Denies local neck swelling  Denies  palpitations  Has occasional constipation  Has occasional polydipsia, and frequency  No recent falls  She is on MVI   Ergocalciferol  50,000 units weekly Levothyroxine  125 mcg daily   HISTORY:  Past Medical History:  Past Medical History:  Diagnosis Date   Aortic atherosclerosis (HCC)    Cancer (HCC)    Thyroid    Dyslipidemia    Dyspnea    HCM   Heart murmur    Hypertension    Hypertrophic cardiomyopathy (HCC)    Osteoporosis    Palpitations    Sciatica    Scoliosis    Past Surgical History:   Social History:  reports that she quit smoking about 4 years ago. Her smoking use included cigarettes. She started smoking about 24 years ago. She has a 5 pack-year smoking history. She has never been exposed to tobacco smoke. She has never used smokeless tobacco. She reports current alcohol use. She reports that she does not use drugs. Family History: family history is not on file.   HOME MEDICATIONS: Allergies as of 03/10/2024       Reactions   Codeine Other (See Comments)   Dizziness   Shellfish Allergy Nausea And Vomiting   Specifically shrimp and scallops        Medication List        Accurate as of March 10, 2024 10:05 AM. If you have any questions, ask your nurse or doctor.          ADULT GUMMY PO Take 2 each by mouth  daily.   ergocalciferol  1.25 MG (50000 UT) capsule Commonly known as: VITAMIN D2 Take 1 capsule (50,000 Units total) by mouth once a week.   fluticasone  50 MCG/ACT nasal spray Commonly known as: FLONASE  Place 1 spray into both nostrils daily. Begin by using 2 sprays in each nare daily for 3 to 5 days, then decrease to 1 spray in each nare daily.   levocetirizine 5 MG tablet Commonly known as: XYZAL  Take 1 tablet (5 mg total) by mouth every evening.   levothyroxine  125 MCG tablet Commonly known as: SYNTHROID  Take 125 mcg by mouth daily before breakfast.   levothyroxine  150 MCG tablet Commonly known as: SYNTHROID  TAKE 1 TABLET BY MOUTH  EVERY DAY   metoprolol  succinate 100 MG 24 hr tablet Commonly known as: TOPROL -XL Take 1 tablet (100 mg total) by mouth daily. What changed: how much to take   metoprolol  tartrate 25 MG tablet Commonly known as: LOPRESSOR  Take 1 tablet (25 mg total) by mouth 2 (two) times daily as needed for up to 30 doses (palpitations).   NIFEdipine  60 MG 24 hr tablet Commonly known as: PROCARDIA  XL/NIFEDICAL XL Take 60 mg by mouth daily.   rosuvastatin  10 MG tablet Commonly known as: CRESTOR  Take 10 mg by mouth every evening.   tiZANidine 2 MG tablet Commonly known as: ZANAFLEX Take 2 mg by mouth 2 (two) times daily as needed.   traMADol  50 MG tablet Commonly known as: ULTRAM  Take 1 tablet (50 mg total) by mouth every 6 (six) hours as needed for moderate pain.          REVIEW OF SYSTEMS: A comprehensive ROS was conducted with the patient and is negative except as per HPI    OBJECTIVE:  VS:BP 120/70 (BP Location: Left Arm, Patient Position: Sitting, Cuff Size: Normal)   Pulse (!) 53   Ht 5\' 3"  (1.6 m)   Wt 150 lb (68 kg)   SpO2 99%   BMI 26.57 kg/m    Wt Readings from Last 3 Encounters:  03/10/24 150 lb (68 kg)  09/16/23 152 lb 12.8 oz (69.3 kg)  05/02/23 165 lb (74.8 kg)     EXAM: General: Pt appears well and is in NAD  Neck: General: Supple without adenopathy. Thyroid : no  nodule appreciated  Lungs: Clear with good BS bilat with no rales, rhonchi, or wheezes  Heart: Auscultation: RRR. + Murmur   Abdomen: soft, nontender, without masses or organomegaly palpable  Extremities:  BL LE: No pretibial edema normal ROM and strength.  Mental Status: Judgment, insight: Intact Orientation: Oriented to time, place, and person Mood and affect: No depression, anxiety, or agitation     DATA REVIEWED:   Latest Reference Range & Units 05/02/23 09:26  Albumin  3.5 - 5.2 g/dL 3.6  VITD 14.78 - 295.62 ng/mL 12.12 (L)  (L): Data is abnormally low  Latest Reference Range & Units  05/02/23 09:26  TSH 0.35 - 5.50 uIU/mL 20.78 (H)  (H): Data is abnormally high    Latest Reference Range & Units 04/18/23 10:47  Sodium 135 - 145 mmol/L 138  Potassium 3.5 - 5.1 mmol/L 3.6  Chloride 98 - 111 mmol/L 100  CO2 22 - 32 mmol/L 29  Glucose 70 - 99 mg/dL 130 (H)  BUN 8 - 23 mg/dL 9  Creatinine 8.65 - 7.84 mg/dL 6.96 (H)  Calcium  8.9 - 10.3 mg/dL 9.3  Anion gap 5 - 15  9  GFR, Estimated >60 mL/min 54 (L)     DXA  10/2021  AP t-score -0.5 1/3rd distal forearm -0.9    Thyroid  pathology 10/15/2022  FINAL MICROSCOPIC DIAGNOSIS:   A. 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  - DEGENERATIVE CHANGES AND FOCAL CALCIFICATIONS PRESENT  - BIOPSY SITE CHANGES PRESENT  - NEGATIVE FOR MALIGNANCY     ASSESSMENT/PLAN/RECOMMENDATIONS:    Hyperparathyroidism:  -Patient with elevated PTH, serum calcium  including ionized calcium  is at the upper limit of normal in the past  -24-hour urinary calcium  was normal at 179 mg in January 2023 -She had a normal DXA scan in January 2023 including a T score of -0.9 at the distal one third of the forearm -No indication for surgical intervention at this time -Most recent serum calcium  has been normal   Recommendations Avoid OTC calcium  tablets Stay hydrated Consume 2-3 servings of calcium  daily    2. Postoperative Hypothyroidism:  -She is s/p total thyroidectomy with benign pathology on 10/15/2022 -Patient is clinically euthyroid -Pt educated extensively on the correct way to take levothyroxine  (first thing in the morning with water, 30 minutes before eating or taking other medications). - Pt encouraged to double dose the following day if she were to miss a dose given long half-life of levothyroxine . -TSH remains elevated will increase the dose and encourage compliance   Medication   levothyroxine  150 mcg daily    3. Vitamin D  deficiency  -Patient with  imperfect adherence to OTC vitamin D  - will start ergocalciferol  50,000 weekly   Follow-up in 6 months  Signed electronically by: Natale Bail, MD  [facility] Endocrinology  [facility] [address] [place] [place], [place] [address] Phone: [phone] FAX: [phone]   CC: Ulysees Gander, MD [address] [place] [address] Phone: [phone] Fax: [phone]   Return to Endocrinology clinic as below: Future Appointments  Date Time Provider Department Center  03/10/2024 10:30 AM Amariyon Maynes, Julian Obey, MD [facility] None  04/02/2024  9:20 AM Jann Melody, MD [facility]

## 2024-03-10 NOTE — Patient Instructions (Signed)
  Stay Hydrated  Avoid over the counter calcium  tablet  Consume 2-3 servings of calcium  in the food daily ( low fat dairy and green leafy vegetables)    You are on levothyroxine  - which is your thyroid  hormone supplement. You MUST take this consistently.  You should take this first thing in the morning on an empty stomach with water. You should not take it with other medications. Wait to 1hr prior to eating. If you are taking any vitamins - please take these in the evening.   If you miss a dose, please take your missed dose the following day (double the dose for that day). You should have a pill box for ONLY levothyroxine  on your bedside table to help you remember to take your medications.

## 2024-03-11 ENCOUNTER — Ambulatory Visit: Payer: Self-pay | Admitting: Internal Medicine

## 2024-03-11 MED ORDER — ERGOCALCIFEROL 1.25 MG (50000 UT) PO CAPS: 50000.0000 [IU] | ORAL_CAPSULE | ORAL | 3 refills | Status: AC

## 2024-03-11 MED ORDER — LEVOTHYROXINE SODIUM 150 MCG PO TABS: 150.0000 ug | ORAL_TABLET | Freq: Every day | ORAL | 0 refills | Status: DC

## 2024-03-11 NOTE — Telephone Encounter (Signed)
 Please let the patient know that her calcium  and kidney function are normal, and her vitamin D  has normalized but we need to decrease vitamin D  from once weekly to once every other week, so she will be taking 2 of those a month    Thanks

## 2024-04-02 ENCOUNTER — Ambulatory Visit: Attending: Internal Medicine | Admitting: Internal Medicine

## 2024-04-02 VITALS — BP 100/63 | HR 57 | Ht 62.0 in | Wt 148.0 lb

## 2024-04-02 DIAGNOSIS — E782 Mixed hyperlipidemia: Secondary | ICD-10-CM

## 2024-04-02 DIAGNOSIS — R072 Precordial pain: Secondary | ICD-10-CM | POA: Diagnosis not present

## 2024-04-02 DIAGNOSIS — R002 Palpitations: Secondary | ICD-10-CM

## 2024-04-02 DIAGNOSIS — I493 Ventricular premature depolarization: Secondary | ICD-10-CM

## 2024-04-02 DIAGNOSIS — I422 Other hypertrophic cardiomyopathy: Secondary | ICD-10-CM

## 2024-04-02 MED ORDER — MAVACAMTEN 5 MG PO CAPS: 5.0000 mg | ORAL_CAPSULE | Freq: Every day | ORAL | 0 refills | Status: DC

## 2024-04-02 NOTE — Patient Instructions (Signed)
 Medication Instructions:  Your physician has recommended you make the following change in your medication:  You are projected to start mavacamten (Camzyos) 5 mg by mouth once daily on July 28 this date may change  *If you need a refill on your cardiac medications before your next appointment, please call your pharmacy*  Lab Work: NONE If you have labs (blood work) drawn today and your tests are completely normal, you will receive your results only by: MyChart Message (if you have MyChart) OR A paper copy in the mail If you have any lab test that is abnormal or we need to change your treatment, we will call you to review the results.  Testing/Procedures: You will need an Echocardiogram 4, 8, and 12 weeks after starting mavacamten (Camzyos). Your physician has requested that you have an echocardiogram. Echocardiography is a painless test that uses sound waves to create images of your heart. It provides your doctor with information about the size and shape of your heart and how well your heart's chambers and valves are working. This procedure takes approximately one hour. There are no restrictions for this procedure. Please do NOT wear cologne, perfume, aftershave, or lotions (deodorant is allowed). Please arrive 15 minutes prior to your appointment time.  Please note: We ask at that you not bring children with you during ultrasound (echo/ vascular) testing. Due to room size and safety concerns, children are not allowed in the ultrasound rooms during exams. Our front office staff cannot provide observation of children in our lobby area while testing is being conducted. An adult accompanying a patient to their appointment will only be allowed in the ultrasound room at the discretion of the ultrasound technician under special circumstances. We apologize for any inconvenience.   Echo #1 Due Aug 18-22 Echo #2 Due Sept 15-19 Echo #3 Due Oct 13-17  Follow-Up: At [facility], you and your  health needs are our priority.  As part of our continuing mission to provide you with exceptional heart care, our providers are all part of one team.  This team includes your primary Cardiologist (physician) and Advanced Practice Providers or APPs (Physician Assistants and Nurse Practitioners) who all work together to provide you with the care you need, when you need it.  Your next appointment:   4 month(s)  Provider:   Stanly Leavens, MD We recommend signing up for the patient portal called MyChart.  Sign up information is provided on this After Visit Summary.  MyChart is used to connect with patients for Virtual Visits (Telemedicine).  Patients are able to view lab/test results, encounter notes, upcoming appointments, etc.  Non-urgent messages can be sent to your provider as well.   To learn more about what you can do with MyChart, go to [email or web address].

## 2024-04-02 NOTE — Progress Notes (Signed)
 Cardiology Office Note:    Date:  04/02/2024   ID:  Barnie Medicine, DOB 03-07-1951, MRN [id number]  PCP:  Grace Leni Edyth DELENA, MD   [facility] Providers Cardiologist:  None     Referring MD: Grace Leni Edyth DELENA, MD   CC: HCM  History of Present Illness:    Grace Dyer is a 73 y.o. female with a hx of HCM (resting LVOT gradient 29, max thickness 19 mm, ~1% LGE using 6 standard deviation approach).   2022: August found to have an inducible gradient of 70 mm Hg. 2023: She was asymptomatic.  At our last visit she was eating chicken wings during our office visit without post prandial symptoms.  Did not wish to start medication therapy.  Did not wish to start medication or SRT interventions. 2024: since she had her thyroid  removed she is taking her metoprolol  tartrate PRN more frequently.  She has a history of severe obstructive hypertrophic cardiomyopathy with a maximal thickness of 19 mm and a 1% LGE burden. She was asymptomatic in 2023 and after her thyroidectomy in 2024. Recently, she has experienced episodes of palpitations and dyspnea while at rest, which resolved with deep breathing and sitting in front of the air conditioning. These episodes occurred while she was sitting and watching her granddaughter or petting her cat.  She experiences dyspnea on exertion, particularly when walking to pay her rent or returning from the mailbox, which requires her to rest for a few minutes. She notes that her heart 'isn't beating as it used to be' and that she can no longer walk the distances she used to without symptoms. No lightheadedness or fainting with quick movements or after meals.  She has been on traditional medications to manage her symptoms. Family history reveals that her oldest son and daughter have been hospitalized for similar heart rhythm issues.  Past Medical History:  Diagnosis Date   Aortic atherosclerosis (HCC)    Cancer (HCC)    Thyroid    Dyslipidemia    Dyspnea     HCM   Heart murmur    Hypertension    Hypertrophic cardiomyopathy (HCC)    Osteoporosis    Palpitations    Sciatica    Scoliosis     Past Surgical History:  Procedure Laterality Date   ABDOMINAL HYSTERECTOMY  1997   total   HIP ARTHROPLASTY Right 2008   HIP ARTHROPLASTY Left 2013   LAPAROTOMY N/A 04/14/2023   Procedure: EXPLORATORY LAPAROTOMY with reduction of internal hernia and small bowel resection;  Surgeon: Debby Hila, MD;  Location: [facility] OR;  Service: General;  Laterality: N/A;   THYROIDECTOMY N/A 10/15/2022   Procedure: TOTAL THYROIDECTOMY;  Surgeon: Eletha Boas, MD;  Location: [facility] ORS;  Service: General;  Laterality: N/A;   WISDOM TOOTH EXTRACTION     age 31    Current Medications: Current Meds  Medication Sig   ergocalciferol  (VITAMIN D2) 1.25 MG (50000 UT) capsule Take 1 capsule (50,000 Units total) by mouth every 14 (fourteen) days.   levocetirizine (XYZAL ) 5 MG tablet Take 1 tablet (5 mg total) by mouth every evening.   levothyroxine  (SYNTHROID ) 150 MCG tablet Take 1 tablet (150 mcg total) by mouth daily.   mavacamten (CAMZYOS) 5 MG CAPS capsule Take 1 capsule (5 mg total) by mouth daily.   metoprolol  succinate (TOPROL -XL) 100 MG 24 hr tablet Take 1 tablet (100 mg total) by mouth daily. (Patient taking differently: Take 150 mg by mouth daily.)   metoprolol  tartrate (LOPRESSOR )  25 MG tablet Take 1 tablet (25 mg total) by mouth 2 (two) times daily as needed for up to 30 doses (palpitations).   NIFEdipine  (PROCARDIA  XL/NIFEDICAL XL) 60 MG 24 hr tablet Take 60 mg by mouth daily.   rosuvastatin  (CRESTOR ) 10 MG tablet Take 10 mg by mouth every evening.   tiZANidine (ZANAFLEX) 2 MG tablet Take 2 mg by mouth 2 (two) times daily as needed.   traMADol  (ULTRAM ) 50 MG tablet Take 1 tablet (50 mg total) by mouth every 6 (six) hours as needed for moderate pain.     Allergies:   Codeine and Shellfish allergy   Social History   Socioeconomic History   Marital status: Widowed     Spouse name: Not on file   Number of children: Not on file   Years of education: Not on file   Highest education level: Not on file  Occupational History   Not on file  Tobacco Use   Smoking status: Former    Current packs/day: 0.00    Average packs/day: 0.3 packs/day for 20.0 years (5.0 ttl pk-yrs)    Types: Cigarettes    Start date: 2001    Quit date: 2021    Years since quitting: 4.4    Passive exposure: Never   Smokeless tobacco: Never  Vaping Use   Vaping status: Never Used  Substance and Sexual Activity   Alcohol use: Yes    Comment: rare   Drug use: Never   Sexual activity: Not on file  Other Topics Concern   Not on file  Social History Narrative   Not on file   Social Drivers of Health   Financial Resource Strain: Not on file  Food Insecurity: No Food Insecurity (10/15/2022)   Hunger Vital Sign    Worried About Running Out of Food in the Last Year: Never true    Ran Out of Food in the Last Year: Never true  Transportation Needs: No Transportation Needs (10/15/2022)   PRAPARE - Administrator, Civil Service (Medical): No    Lack of Transportation (Non-Medical): No  Physical Activity: Not on file  Stress: Not on file  Social Connections: Not on file    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     Cardiac Studies & Procedures   ______________________________________________________________________________________________   STRESS TESTS  ECHOCARDIOGRAM STRESS TEST 05/16/2022  Narrative EXERCISE STRESS ECHO REPORT   --------------------------------------------------------------------------------  Patient Name:   Grace Dyer Date of Exam: 05/15/2022 Medical Rec #:  [id number]        Height:       63.0 in Accession #:    [id number]       Weight:       162.0 lb Date of Birth:  [birth year]        BSA:          1.768 m Patient Age:    71 years          BP:           152/73 mmHg Patient Gender: F                HR:           63 bpm. Exam Location:  [facility]  Procedure: Stress Echo, Limited Color Doppler and Cardiac Doppler  Indications:    I42.2 Hypertrophic cardiomyopathy Evaluation of the HOCM gradient post exercise.  History:        Patient has no prior history of Echocardiogram examinations. Echo performed elsewhere on Feb 15, 2022 demonstrated severe asymmetric septal hypertrophic cardiomyopathy-LVEF 70% HOCM at rest 29 mmHg and SAM. septal diameter of 1.9 cm.  Sonographer:    Nolon Berg [facility], RDCS Referring Phys: [id number] [facility] Karena Kinker  IMPRESSIONS   1. This is an inconclusive stress echocardiogram for ischemia. 2. This is an indeterminate risk study for ischemia (study done for inducivle LVOT gradient). 3. At rest, hypderynamie LV with LVEF 70%. Septal thickness 26 mm. 4. There is resting systolic anterior motion of the mitral valve and associated mitral regurgitation. There is notable contimation of the LVOT gradient by mitral valve signal 5. Back calculation of gradient using mitral regurgitaiton method shows a resting gradient of 29 mm Hg but an inducible gradient of 90 mm Hg. 6. 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 7. Trivial pericardial effusion without tamponade. 8. Left Atrial dilation.  FINDINGS  Exam Protocol:   Patient Performance: The heart rate at peak stress was 125 bpm. The target heart rate was calculated to be 127 bpm. The percentage of maximum predicted heart rate achieved was 83.9 %. The baseline blood pressure was 135/69 mmHg. The blood pressure at peak stress was 135/53 mmHg. The patient developed fatigue during the stress exam.  EKG: Resting EKG showed normal sinus rhythm. The patient developed no abnormal EKG findings during exercise.   2D Echo Findings: The baseline ejection fraction was 70%. Baseline regional wall motion abnormalities were not present. This is an  inconclusive stress echocardiogram for ischemia.   Stanly Leavens MD Electronically signed on 05/16/2022 at 10:50:36 AM     Final      MONITORS  LONG TERM MONITOR (3-14 DAYS) 03/12/2023  Narrative   Patient had a minimum heart rate of 41 bpm, maximum heart rate of 179 bpm, and average heart rate of 57 bpm.   Predominant underlying rhythm was sinus rhythm.   Two short runs of NSVT lasting 7 beats at longest.   Short runs of atrial tachycardia, longest 19 beats.   Isolated PACs were rare (<1.0%).   Isolated PVCs were rare (<1.0%).   Triggered and diary events associated with sinus rhythm PACs or PVCs  Rare NSVT in the setting of HCM.     CARDIAC MRI  MR CARDIAC MORPHOLOGY W WO CONTRAST 04/19/2022  Narrative CLINICAL DATA:  Hypertrophic Cardiomyopathy  EXAM: CARDIAC MRI  TECHNIQUE: The patient was scanned on a 1.5 Tesla Siemens magnet. A dedicated cardiac coil was used. Functional imaging was done using Fiesta sequences. 2,3, and 4 chamber views were done to assess for RWMA's. Modified Simpson's rule using a short axis stack was used to calculate an ejection fraction on a dedicated work [employer], [employer]. The patient received 9 cc of Gadavist . After 10 minutes inversion recovery sequences were used to assess for infiltration and scar tissue.  CONTRAST:  Gadavist   FINDINGS: Severe LAE. Normal RA. No ASD/PFO. Normal ascending thoracic aorta 3.4 cm. Small to moderate circumferential pericardial effusion Mildly thickened MV with SAM and mild/moderate appearing MR. Normal TV/PV. Normal tri leaflet AV Severe asymmetrical septal hypertrophy 19 mm with lateral wall 9 mm Quantitative EF 72% (EDV 132 cc ESV 37 cc SV 95 cc) Normal RV size and function  T2 normal 51 msec  T1 global mildly increased 1076 msec  ECV: Diffusely elevated > 55%  Delayed gadolinium images show diffuse mid/sub epicardial uptake including regions extending  beyond the  hypertrophied septum  IMPRESSION: 1. Morphologic findings consistent with hypertrophic cardiomyopathy with asymmetric septal hypertrophy 19 mm, SAM with LVOT turbulence and MR.  2. Diffuse mid/subepicardial gadolinium uptake and diffusely elevated T1/ECV suggest possibility of amyloid masquerading as HOCM  3.  Severe LAE  4.  Mild/Moderate circumferential pericardial effusion  5. Suggest f/u Echo with strain imaging and Tc Pyrophosphate scan to further investigate  Maude Emmer   Electronically Signed By: Maude Emmer M.D. On: 04/19/2022 17:54   ______________________________________________________________________________________________        EKGs/Labs/Other Studies Reviewed:    Recent Labs: 04/17/2023: Magnesium 1.9 04/19/2023: Hemoglobin 9.0; Platelets 174 09/16/2023: ALT 18 03/10/2024: BUN 16; Creat 0.90; Potassium 4.4; Sodium 138; TSH 1.03  Recent Lipid Panel    Component Value Date/Time   CHOL 159 09/16/2023 0934   TRIG 50 09/16/2023 0934   HDL 93 09/16/2023 0934   CHOLHDL 1.7 09/16/2023 0934   LDLCALC 55 09/16/2023 0934        Physical Exam:    VS:  BP 100/63 (BP Location: Left Arm)   Pulse (!) 57   Ht 5' 2 (1.575 m)   Wt 148 lb (67.1 kg)   SpO2 97%   BMI 27.07 kg/m     Wt Readings from Last 3 Encounters:  04/02/24 148 lb (67.1 kg)  03/10/24 150 lb (68 kg)  09/16/23 152 lb 12.8 oz (69.3 kg)    GEN:  Well nourished, well developed in no acute distress HEENT: Normal NECK: No JVD CARDIAC: regular bradycardia with harsh systolic murmur with hand grip, with standing, no rubs, gallops  RESPIRATORY:  Clear to auscultation without rales, wheezing or rhonchi  ABDOMEN: Soft, non-tender, non-distended MUSCULOSKELETAL:  No edema; No deformity  SKIN: Warm and dry NEUROLOGIC:  Alert and oriented x 3 PSYCHIATRIC:  Normal affect   ASSESSMENT:    1. Precordial pain   2. Mixed hyperlipidemia   3. Hypertrophic cardiomyopathy (HCC)   4. Palpitations    5. PVC (premature ventricular contraction)     PLAN:    Hypertrophic Obstructive Cardiomyopathy (HOCM)   - Septal Variant - Inducible gradient of 90 mm Hg in 2023 with no  - maximal septal thickness of 19 mm - NYHA II- now unable walk around the corner with resting  bradycardia and SBP 100; she would be unable to tolerate more AV nodal therapy - Gene negative - HCM- AF score 31+, with NSVT; getting one week non live heart monitor 9 Severe obstructive hypertrophic cardiomyopathy with a 1% LGE burden and maximal thickness of 19 mm. Recent mild dyspnea on exertion, particularly when walking uphill. Symptoms managed with current medications, but metoprolol  increase is limited by hypotension and bradycardia. Discussed Mavacamten to reduce symptoms and potentially decrease metoprolol  dose. Medication is reversible, requiring regular monitoring. After prolonged discussion she is not amenable to SRT unless NYHA III/IV sx which is reasonable - Provide educational materials on HOCM for patient and family   - CMR notable thickness 19 mm, minimal LGE using 6 standard deviation method - Family history with no prior HCM or SCD, Discussed family screening  (Genetic eval 02/2023; eldest son screened; eldest daughter screened negative in 2024; youngest daughter pending screening)  Non obstructive CAD Hyperlipidemia  with aortic atherosclerosis - no sx, LDL goal < 70; no change in therapy  Hypertension   - Continue current antihypertensive medications    Time Spent Directly with Patient:   I have spent a total of 44 minutes with the patient  reviewing notes, imaging, EKGs, labs,  and examining the patient as well as establishing an assessment and plan that was discussed personally with the patient. Discussed disease state education , using shared decision making tools and cardiac modeling, SDM for CMI therapy  Follow up with me in three months   Medication Adjustments/Labs and Tests Ordered: Current  medicines are reviewed at length with the patient today.  Concerns regarding medicines are outlined above.  Orders Placed This Encounter  Procedures   EKG 12-Lead   ECHOCARDIOGRAM LIMITED   ECHOCARDIOGRAM LIMITED   ECHOCARDIOGRAM LIMITED   Meds ordered this encounter  Medications   mavacamten (CAMZYOS) 5 MG CAPS capsule    Sig: Take 1 capsule (5 mg total) by mouth daily.    Dispense:  35 capsule    Refill:  0    Patient Instructions  Medication Instructions:  Your physician has recommended you make the following change in your medication:  You are projected to start mavacamten (Camzyos) 5 mg by mouth once daily on July 28 this date may change  *If you need a refill on your cardiac medications before your next appointment, please call your pharmacy*  Lab Work: NONE If you have labs (blood work) drawn today and your tests are completely normal, you will receive your results only by: MyChart Message (if you have MyChart) OR A paper copy in the mail If you have any lab test that is abnormal or we need to change your treatment, we will call you to review the results.  Testing/Procedures: You will need an Echocardiogram 4, 8, and 12 weeks after starting mavacamten (Camzyos). Your physician has requested that you have an echocardiogram. Echocardiography is a painless test that uses sound waves to create images of your heart. It provides your doctor with information about the size and shape of your heart and how well your heart's chambers and valves are working. This procedure takes approximately one hour. There are no restrictions for this procedure. Please do NOT wear cologne, perfume, aftershave, or lotions (deodorant is allowed). Please arrive 15 minutes prior to your appointment time.  Please note: We ask at that you not bring children with you during ultrasound (echo/ vascular) testing. Due to room size and safety concerns, children are not allowed in the ultrasound rooms during  exams. Our front office staff cannot provide observation of children in our lobby area while testing is being conducted. An adult accompanying a patient to their appointment will only be allowed in the ultrasound room at the discretion of the ultrasound technician under special circumstances. We apologize for any inconvenience.   Echo #1 Due Aug 18-22 Echo #2 Due Sept 15-19 Echo #3 Due Oct 13-17  Follow-Up: At [facility], you and your health needs are our priority.  As part of our continuing mission to provide you with exceptional heart care, our providers are all part of one team.  This team includes your primary Cardiologist (physician) and Advanced Practice Providers or APPs (Physician Assistants and Nurse Practitioners) who all work together to provide you with the care you need, when you need it.  Your next appointment:   4 month(s)  Provider:   Stanly Leavens, MD We recommend signing up for the patient portal called MyChart.  Sign up information is provided on this After Visit Summary.  MyChart is used to connect with patients for Virtual Visits (Telemedicine).  Patients are able to view lab/test results, encounter notes, upcoming appointments, etc.  Non-urgent messages can be sent  to your provider as well.   To learn more about what you can do with MyChart, go to [email or web address].          Stanly Leavens, MD FASE [facility] Cardiologist [facility]  [address] [place], [place] [address] [phone]  10:41 AM

## 2024-04-03 ENCOUNTER — Telehealth: Payer: Self-pay | Admitting: Pharmacist

## 2024-04-03 NOTE — Telephone Encounter (Signed)
 PA for Camzyos  initiated - key: [id number] Determination pending

## 2024-04-07 NOTE — Telephone Encounter (Signed)
 PA denied - per insurance patient need to try 4 of the these  covered drugs  Atenolol Bisoprolol Diltiazem Propranolol Verapamil   OR  provider need to give them specific medical reasons why four of the covered drug(s) are not appropriate for the patient   Appeal faxed over on April 03, 2024. Call [employer] [facility] [phone] to confirm they received the appeal. Spoke to Julie,I. At [facility] they have received the appeal and it usually takes 15 to 30 days for final determination.

## 2024-04-09 ENCOUNTER — Telehealth: Payer: Self-pay

## 2024-04-09 NOTE — Telephone Encounter (Signed)
 Received benefits investigation on our fax for pt's Camzyos  (see chart media).

## 2024-04-13 ENCOUNTER — Telehealth: Payer: Self-pay | Admitting: Pharmacy Technician

## 2024-04-13 ENCOUNTER — Other Ambulatory Visit (HOSPITAL_COMMUNITY): Payer: Self-pay

## 2024-04-13 NOTE — Telephone Encounter (Signed)
 [employer] grant secured, [facility] pharmacy provided information, patient provided information. New encounter created with that information.

## 2024-04-13 NOTE — Telephone Encounter (Signed)
 Patient Advocate Encounter   The patient was approved for a [employer] grant that will help cover the cost of camzyos  Total amount awarded, 4500.  Effective: 03/14/24 - 03/13/25   APW:[id number] ERW:[id number] Hmnle:[id number] PI:[id number] [employer] ID: [id number]   Pharmacy provided with approval and processing information. Patient informed via telephone    [phone] [facility] for camzyos  is where I need to give [employer] grant info per bms/covermymeds pharmacy. I gave them the [employer] grant information. They said they would just need the prescription.   [facility] pharmacy escript is [place], [place]  location

## 2024-04-13 NOTE — Telephone Encounter (Signed)
 Key :  [id number] appeal for Camzyos  5 mg has been approved.

## 2024-04-13 NOTE — Telephone Encounter (Signed)
 Patient has paid so far $149.34 deductible so remaining amount is $1851 . Spoke to be [employer] benefit investigation team. Will enroll patient in [facility] grant and [employer] want patient to call [phone] to order free trial prescription.- patient informed.  We will route this med assistance team to enroll patient in [facility] grant.

## 2024-04-17 ENCOUNTER — Telehealth: Payer: Self-pay | Admitting: Internal Medicine

## 2024-04-17 NOTE — Telephone Encounter (Signed)
 Pt c/o medication issue:  1. Name of Medication:   mavacamten  (CAMZYOS ) 5 MG CAPS capsule    2. How are you currently taking this medication (dosage and times per day)? As written   3. Are you having a reaction (difficulty breathing--STAT)? No   4. What is your medication issue? Pt would like to speak with nurse about how to take this med and how her echos should be scheduled. Please advise.

## 2024-04-17 NOTE — Telephone Encounter (Signed)
 Patient states she has received the medication - Camzyos  and she is aware to not start it yet.    She wants to know does she need an echo before starting the medicine and also the dates on the AVS for the echo will not work out for her because as she understands it she will have $40 copays for each echo, and now has to pay for transportation to/from the office.  Her social security check and pension check dates don't coincide with the testing dates to allow her to have the money to pay for all of it each time.  Pt aware I will forward to Dr. Santo and his nurse who is not in the office today.  Once reviewed, Hamp will call her back to discuss.

## 2024-04-17 NOTE — Telephone Encounter (Signed)
 Patient returned RN's call.

## 2024-04-17 NOTE — Telephone Encounter (Signed)
 Left message for patient to call back

## 2024-04-23 NOTE — Telephone Encounter (Signed)
 She has a grant for the medication. It sounds like the issue is the copay for the echo.  I would have her call the 1800 number Dr. JAYSON provided. I will also ask the rep

## 2024-05-06 NOTE — Telephone Encounter (Signed)
 Left a message to call back.  Would like to know if pt started Camzyos  on 05/04/24 as planned.

## 2024-05-06 NOTE — Telephone Encounter (Signed)
 I called and LVM for patient to call back. Calling to see if she stated Camzyos . There was a concern about echo copay cost and cost for travel to the office. Sounds like her concern was that she wouldn't have the money at the time of the apt to pay.  I will confirm, but I believe she could ask for the copay to be billed to her.

## 2024-05-07 ENCOUNTER — Telehealth: Payer: Self-pay | Admitting: Licensed Clinical Social Worker

## 2024-05-07 NOTE — Telephone Encounter (Signed)
 I called and spoke with patient. Let her know that she can asked to be billed for echo copay so she does not have to pay at the time of visit. She was relieved and said she would just have to pay for her transportation. She has not started Camzyos  yet. Her echos will need to be rescheduled since her start date was supposed to be 7/28. I advised that I will have Shamea reach out to her to reschedule.

## 2024-05-07 NOTE — Telephone Encounter (Signed)
 Called pt reports will start Camzyos  tomorrow 05/08/24.  Advised pt that 4 week Echo was already rescheduled to meet time frame for [facility].  Advised pt will reschedule 8 and 12 week echo.  8 week echo changed to 9/23 at 8:20 am.

## 2024-05-07 NOTE — Progress Notes (Unsigned)
 Heart and Vascular Care Navigation  05/07/2024  Grace Dyer 1951/10/08 [id number]  Reason for Referral: transportation/medication costs Patient is participating in a Managed Medicaid Plan: No, [facility] Medicare  Engaged with patient by telephone for initial visit for Heart and Vascular Care Coordination.                                                                                                   Assessment:                                     LCSW was able to reach pt today at [phone]. Introduced self, role, reason for call. Confirmed home address, PCP, and pt is retired receives Tree surgeon income only. She denies any issues with costs of housing, utilities, or food. She does sometimes have concerns about costs related to medications and transportation for testing that has been ordered. We discussed referral to [facility] team with Senior Resources to see if she may be eligible for programs such as the [employer] Program or Extra Help/Low Income Subsidy to assist with copay and medication costs. She prefers I send their information and she can contact them on her time than for me to complete a soft handoff. We also can send additional resources for transportation, currently pt declines any assistance from our department. Encouraged her to call me if any additional resources needed, no further questions today   HRT/VAS Care Coordination     Patients Home Cardiology Office --  [facility]   Outpatient Care Team Social Worker   Social Worker Name: Marit Lark, [place], [phone]   Living arrangements for the past 2 months Apartment   Lives with: Self   Patient Current Insurance Coverage Managed Medicare   Patient Has Concern With Paying Medical Bills No  some copay challenges   Does Patient Have Prescription Coverage? Yes   Home Assistive Devices/Equipment Cane (specify quad or straight)   HH Agency [facility]       Social History:                                                                              SDOH Screenings   Food Insecurity: No Food Insecurity (05/07/2024)  Housing: Low Risk  (05/07/2024)  Transportation Needs: No Transportation Needs (05/07/2024)  Utilities: Not At Risk (05/07/2024)  Financial Resource Strain: Medium Risk (05/07/2024)  Tobacco Use: Medium Risk (03/10/2024)  Health Literacy: Adequate Health Literacy (05/07/2024)    SDOH Interventions: Financial Resources:  Surveyor, quantity Strain Interventions: Programmer, applications Provided [employer] for [employer] and Extra Help/LIS  Food Insecurity:  Food Insecurity Interventions: Intervention Not Indicated  Housing Insecurity:  Housing Interventions: Intervention Not Indicated  Transportation:  Transportation Interventions: Other (Comment) (offered transportation resources- pt usually pays for transport previously had insurance benefits)    Other Care Navigation Interventions:     Provided Pharmacy assistance resources  LIS/Extra Help information and support from [facility] team at [employer] if interested   Follow-up plan:   LCSW has mailed pt information about transportation and information about LIS/Extra Help and Medicare Savings Programs through [employer] with [employer]. I have offered to send referral but pt would like information sent to her to use on her own time, will include my card. Sent f/u message to RN regarding pt echo and if need to reschedule, she plans to take her Camzyos  starting tomorrow.

## 2024-05-13 ENCOUNTER — Other Ambulatory Visit: Payer: Self-pay | Admitting: Internal Medicine

## 2024-05-25 ENCOUNTER — Other Ambulatory Visit (HOSPITAL_COMMUNITY)

## 2024-05-26 ENCOUNTER — Encounter: Payer: Self-pay | Admitting: Gastroenterology

## 2024-06-02 ENCOUNTER — Other Ambulatory Visit: Payer: Self-pay | Admitting: Internal Medicine

## 2024-06-02 ENCOUNTER — Ambulatory Visit (HOSPITAL_COMMUNITY)
Admission: RE | Admit: 2024-06-02 | Discharge: 2024-06-02 | Disposition: A | Discharge status: Home / Self Care | Source: Ambulatory Visit | Attending: Cardiovascular Disease | Admitting: Cardiovascular Disease

## 2024-06-02 DIAGNOSIS — I422 Other hypertrophic cardiomyopathy: Secondary | ICD-10-CM | POA: Insufficient documentation

## 2024-06-02 DIAGNOSIS — R072 Precordial pain: Secondary | ICD-10-CM

## 2024-06-02 DIAGNOSIS — R002 Palpitations: Secondary | ICD-10-CM

## 2024-06-02 DIAGNOSIS — E782 Mixed hyperlipidemia: Secondary | ICD-10-CM

## 2024-06-02 DIAGNOSIS — I493 Ventricular premature depolarization: Secondary | ICD-10-CM

## 2024-06-02 LAB — ECHOCARDIOGRAM SQUAT TO STAND
Area-P 1/2: 3.34 cm2
Est EF: 60

## 2024-06-04 ENCOUNTER — Telehealth: Payer: Self-pay | Admitting: Internal Medicine

## 2024-06-04 MED ORDER — MAVACAMTEN 5 MG PO CAPS: 5.0000 mg | ORAL_CAPSULE | Freq: Every day | ORAL | 0 refills | Status: DC

## 2024-06-04 MED ORDER — MAVACAMTEN 2.5 MG PO CAPS: 2.5000 mg | ORAL_CAPSULE | Freq: Every day | ORAL | 0 refills | Status: DC

## 2024-06-04 NOTE — Telephone Encounter (Signed)
 Patient notes that she is doing well.   Since last visit notes improvement in SOB; she still takes a while to walk around the corner but it has improved. - LVOT gradient 19 mm HG; I suspect she has a component of mid cavitary gradient given contamination - LVEF 60%  No chest pain or pressure .  No SOB, Doe has improve and no PND/Orthopnea.  No weight gain or leg swelling.  No palpitations or syncope.  No new medications  Mavacamten  2.5 mg PO daily 8 week echo is pending.   I believe she declined research study involvement.  Stanly Leavens, MD FASE [facility] Cardiologist [facility]  [address] [place], [place] [address] [phone]  12:31 PM

## 2024-06-04 NOTE — Addendum Note (Signed)
 Addended by: RANDY HAMP SAILOR on: 06/04/2024 01:08 PM   Modules accepted: Orders

## 2024-06-04 NOTE — Telephone Encounter (Signed)
 PSF updated on Camzyos  REMS portal.  Script for mavacamten  (Camzyos ) 2.5 mg sent to specialty pharmacy on file.

## 2024-06-09 ENCOUNTER — Telehealth: Payer: Self-pay | Admitting: Internal Medicine

## 2024-06-09 NOTE — Telephone Encounter (Signed)
  Pt c/o medication issue:  1. Name of Medication:   mavacamten  (CAMZYOS ) 2.5 MG CAPS capsule    2. How are you currently taking this medication (dosage and times per day)? As written   3. Are you having a reaction (difficulty breathing--STAT)? No   4. What is your medication issue? Patient said this morning when she woke up she feels dizzy and fatigue, she doesn't have any energy to do anything. She felt like its from taking camzyos  its too strong for her

## 2024-06-09 NOTE — Telephone Encounter (Signed)
 Pt of Dr. Santo. Please advise on a refill.

## 2024-06-09 NOTE — Telephone Encounter (Signed)
*  STAT* If patient is at the pharmacy, call can be transferred to refill team.   1. Which medications need to be refilled? (please list name of each medication and dose if known) mavacamten  (CAMZYOS ) 2.5 MG CAPS capsule    2. Would you like to learn more about the convenience, safety, & potential cost savings by using the [facility]?     3. Are you open to using the [facility] (Type [facility].  ).   4. Which pharmacy/location (including street and city if local pharmacy) is medication to be sent to?  [facility], [place] - [address]   5. Do they need a 30 day or 90 day supply? 30 day  Patient only has 2 pills left.

## 2024-06-09 NOTE — Telephone Encounter (Signed)
 [facility] in [facility] to mav[facility]ten  script.  Was told that pt needs to contact their office for co pay assistance.  Advised rep that pharmacy should reach out to pt not our office.   Rep reports has not received script for mavacamten  2.5 mg.  Advised rep script was sent on 06/04/24 in note to pharmacy discontinue 5 mg dose pt to take 2.5 mg.  Transferred to Pharmacist confirms has script for 2.5 mg dose.  Pharmacy to reach out to pt for co pay assist.  No further needs.

## 2024-06-10 NOTE — Telephone Encounter (Signed)
 [facility] requesting verbal order for mavacamten  2.5 mg PO every day.  Advised I spoke with pharmacy yesterday and was told script was in the system.  Pharmacist reports does not see order.  Verbal order given.

## 2024-06-10 NOTE — Telephone Encounter (Signed)
 Left a message to call back.

## 2024-06-10 NOTE — Telephone Encounter (Signed)
 Patient returned RN's call regarding medication and stated the Camzyos  company wants a call back to phone# [phone]. Patient noted she only has 1 tablet left.  Patient requested a call back after 11:00 am as she has another doctor's appointment.

## 2024-06-10 NOTE — Telephone Encounter (Signed)
 Called pt to f/u concerns.  Pt reports yesterday felt really tired.  Woke up today feeling fine.  Expresses thought could be r/t Camzyos  but now doesn't know as feels okau. Pt reports increased fatigue was only a 1 time thing at this time.  Denies palpitations, wt gain and swelling. Had an OV with PCP to address concerns r/t back pain.   Advised pt to continue to monitor symptoms if continues to happen to call back.  Pt expresses understanding.

## 2024-06-23 ENCOUNTER — Other Ambulatory Visit (HOSPITAL_COMMUNITY)

## 2024-06-30 ENCOUNTER — Ambulatory Visit (HOSPITAL_COMMUNITY)
Admission: RE | Admit: 2024-06-30 | Discharge: 2024-06-30 | Disposition: A | Discharge status: Home / Self Care | Source: Ambulatory Visit | Attending: Internal Medicine | Admitting: Internal Medicine

## 2024-06-30 ENCOUNTER — Other Ambulatory Visit: Payer: Self-pay | Admitting: Internal Medicine

## 2024-06-30 DIAGNOSIS — R002 Palpitations: Secondary | ICD-10-CM

## 2024-06-30 DIAGNOSIS — R072 Precordial pain: Secondary | ICD-10-CM

## 2024-06-30 DIAGNOSIS — E782 Mixed hyperlipidemia: Secondary | ICD-10-CM

## 2024-06-30 DIAGNOSIS — I493 Ventricular premature depolarization: Secondary | ICD-10-CM

## 2024-06-30 DIAGNOSIS — I422 Other hypertrophic cardiomyopathy: Secondary | ICD-10-CM

## 2024-06-30 LAB — ECHOCARDIOGRAM COMPLETE
Area-P 1/2: 2.8 cm2
S' Lateral: 2.4 cm

## 2024-07-01 ENCOUNTER — Other Ambulatory Visit: Payer: Self-pay | Admitting: Internal Medicine

## 2024-07-01 ENCOUNTER — Telehealth: Payer: Self-pay | Admitting: Internal Medicine

## 2024-07-01 NOTE — Telephone Encounter (Signed)
 Called Patient.  Patient notes that she is doing well.   Since last visit notes no new medications- she still takes nifedipine .  She was having bilateral LE swelling now just LLE swelling. There are no interval hospital/ED visit.    No chest pain or pressure .  No SOB/DOE and no PND/Orthopnea.  No weight gain.  No palpitations or syncope.  LVEF 70% LVOT Gradient 50 mm Hg  Continue mavacamten  2.5, her 12 week echo is scheduled. Will stop nifedipine . If LE swelling in two weeks she will message and we will start lasix 20 mg PO PRN LE swelling   Stanly Leavens, MD Cardiologist [facility]  [address] [place], [address] [place], [place] [address] [phone]  12:30 PM

## 2024-07-02 MED ORDER — MAVACAMTEN 2.5 MG PO CAPS: 2.5000 mg | ORAL_CAPSULE | Freq: Every day | ORAL | 0 refills | Status: DC

## 2024-07-02 NOTE — Addendum Note (Signed)
 Addended by: RANDY HAMP SAILOR on: 07/02/2024 08:15 AM   Modules accepted: Orders

## 2024-07-02 NOTE — Telephone Encounter (Signed)
 PSF updated on Camzyos  REMS portal.  Refill of mavacamten  2.5 mg PO every day sent to specialty pharmacy on file.   Nifedipine  removed from medication list per MD note.

## 2024-07-03 ENCOUNTER — Telehealth: Payer: Self-pay | Admitting: Internal Medicine

## 2024-07-03 MED ORDER — MAVACAMTEN 2.5 MG PO CAPS: 2.5000 mg | ORAL_CAPSULE | Freq: Every day | ORAL | 0 refills | Status: DC

## 2024-07-03 NOTE — Telephone Encounter (Signed)
*  STAT* If patient is at the pharmacy, call can be transferred to refill team.   1. Which medications need to be refilled? (please list name of each medication and dose if known)   mavacamten  (CAMZYOS ) 2.5 MG CAPS capsule     2. Would you like to learn more about the convenience, safety, & potential cost savings by using the [facility]? NO   3. Are you open to using the [facility] ([facility]. NO   4. Which pharmacy/location (including street and city if local pharmacy) is medication to be sent to?[facility] - [place], [place] - [address]    5. Do they need a 30 day or 90 day supply? 30 day   Pt is almost out of medication.   Dr. Chandrasekhar increased pts dosage, but did not adjust Rx. Pharmacy has told patient she needs a new prescription for 5mg . Please advise.

## 2024-07-03 NOTE — Telephone Encounter (Signed)
 Called s/w pt who reports was told mavacamten  would be increased to 5 mg.  Advised pt spoke with MD who reports per protocol can't increase dose of mavacamten  until after 12 week echo.  Once 12 week echo is reviewed will determine if increase is appropriate.   Pt reports needs a new script sent to pharmacy.  Advised pt script sent on 07/02/24.  I have resent script for mavacamten  2.5 mg PO every day to [facility].  Pt had no further concerns.

## 2024-07-06 ENCOUNTER — Telehealth: Payer: Self-pay | Admitting: Internal Medicine

## 2024-07-06 MED ORDER — MAVACAMTEN 2.5 MG PO CAPS: 2.5000 mg | ORAL_CAPSULE | Freq: Every day | ORAL | 2 refills | Status: DC

## 2024-07-06 NOTE — Telephone Encounter (Signed)
*  STAT* If patient is at the pharmacy, call can be transferred to refill team.   1. Which medications need to be refilled? (please list name of each medication and dose if known)   mavacamten  (CAMZYOS ) 2.5 MG CAPS capsule     2. Would you like to learn more about the convenience, safety, & potential cost savings by using the [facility]? No   3. Are you open to using the [facility] ([facility]. No   4. Which pharmacy/location (including street and city if local pharmacy) is medication to be sent to?[facility] - [place], [place] - [address]    5. Do they need a 30 day or 90 day supply? 30 day    [facility]. Pt needs it sent to [facility] instead.

## 2024-07-10 ENCOUNTER — Encounter: Payer: Self-pay | Admitting: Internal Medicine

## 2024-07-10 ENCOUNTER — Ambulatory Visit: Admitting: Internal Medicine

## 2024-07-10 ENCOUNTER — Other Ambulatory Visit

## 2024-07-10 VITALS — BP 126/80 | HR 61 | Ht 62.0 in | Wt 148.0 lb

## 2024-07-10 DIAGNOSIS — E213 Hyperparathyroidism, unspecified: Secondary | ICD-10-CM | POA: Diagnosis not present

## 2024-07-10 DIAGNOSIS — E890 Postprocedural hypothyroidism: Secondary | ICD-10-CM | POA: Diagnosis not present

## 2024-07-10 DIAGNOSIS — E559 Vitamin D deficiency, unspecified: Secondary | ICD-10-CM | POA: Diagnosis not present

## 2024-07-10 NOTE — Progress Notes (Signed)
 Name: Grace Dyer  MRN/ DOB: [id number], 1951-04-16    Age/ Sex: 73 y.o., female    PCP: Maree Leni Edyth DELENA, MD   Reason for Endocrinology Evaluation: MNG/ Hypercalcemia      Date of Initial Endocrinology Evaluation: 01/04/2022    HPI: Grace Dyer is a 73 y.o. female with a past medical history of HTN, cardiomyopathy,SVT and Dyslipidemia . The patient presented for initial endocrinology clinic visit on 01/04/2022 for consultative assistance with her Hypercalcemia .   Grace Dyer indicates that she was first diagnosed with hypercalcemia in 2023.   She has no prior history of lithium, or HCTZ use  She denies  history of kidney stones, kidney disease, liver disease, granulomatous disease. She has osteoporosis but no  prior fractures.  She denies  family history of osteoporosis, parathyroid  disease, thyroid  disease.   She has Hx of MNG , was seen by ENT while living in [place] years ago, no hx of FNa's  in the past  Has occasional local neck swelling   24-hour urine collection for calcium  179 mg on 10/25/2021 DXA 10/2021- normal AP T-score -0.5, 1/3rd forearm -0.9    On her initial visit to our clinic she was noted with low vitamin D , she was started on a OTC vitamin D3 2000 IU daily     THYROID  HISTORY: She was diagnosed with multinodular goiter on thyroid  ultrasound 04/2022.  She is s/p right mid nodule FNA 05/11/2022 with atypia of undetermined significance (Bethesda categoryIII), with benign Afirma.  She is s/p FNA of the right inferior nodule 05/11/2022 with atypia of undetermined significance (Bethesda category III) with Afirma suspicious   She is s/p total thyroidectomy 10/15/2022 with benign pathology  She was started on LT-4 replacement  SUBJECTIVE:     Today (07/10/24): Grace Dyer is here for follow-up on hypercalcemia, hyperparathyroidism, and postoperative hypothyroid  Patient follows with cardiology for history of hypertrophic cardiomyopathy  Weight has  been fluctuating She has noted right cervical lymphadenopathy intermittently , no pain , last time was noted was a few minutes ago No palpitations  Has occasional constipation  No recent falls  Continues with  occasional polydipsia, and frequency  She is on MVI occasionally   Ergocalciferol  50,000 units every other week Levothyroxine  150 mcg daily   HISTORY:  Past Medical History:  Past Medical History:  Diagnosis Date   Aortic atherosclerosis    Cancer (HCC)    Thyroid    Dyslipidemia    Dyspnea    HCM   Heart murmur    Hypertension    Hypertrophic cardiomyopathy (HCC)    Osteoporosis    Palpitations    Sciatica    Scoliosis    Past Surgical History:   Social History:  reports that she quit smoking about 4 years ago. Her smoking use included cigarettes. She started smoking about 24 years ago. She has a 5 pack-year smoking history. She has never been exposed to tobacco smoke. She has never used smokeless tobacco. She reports current alcohol use. She reports that she does not use drugs. Family History: family history is not on file.   HOME MEDICATIONS: Allergies as of 07/10/2024       Reactions   Codeine Other (See Comments)   Dizziness   Shellfish Allergy Nausea And Vomiting   Specifically shrimp and scallops        Medication List        Accurate as of July 10, 2024  9:32 AM. If you have  any questions, ask your nurse or doctor.          ergocalciferol  1.25 MG (50000 UT) capsule Commonly known as: VITAMIN D2 Take 1 capsule (50,000 Units total) by mouth every 14 (fourteen) days.   levocetirizine 5 MG tablet Commonly known as: XYZAL  Take 1 tablet (5 mg total) by mouth every evening.   levothyroxine  150 MCG tablet Commonly known as: SYNTHROID  Take 1 tablet (150 mcg total) by mouth daily.   mavacamten  2.5 MG Caps capsule Commonly known as: CAMZYOS  Take 1 capsule (2.5 mg total) by mouth daily.   metoprolol  succinate 100 MG 24 hr tablet Commonly  known as: TOPROL -XL Take 1 tablet (100 mg total) by mouth daily. What changed: how much to take   metoprolol  tartrate 25 MG tablet Commonly known as: LOPRESSOR  Take 1 tablet (25 mg total) by mouth 2 (two) times daily as needed for up to 30 doses (palpitations).   rosuvastatin  10 MG tablet Commonly known as: CRESTOR  Take 10 mg by mouth every evening.   tiZANidine 2 MG tablet Commonly known as: ZANAFLEX Take 2 mg by mouth 2 (two) times daily as needed.   traMADol  50 MG tablet Commonly known as: ULTRAM  Take 1 tablet (50 mg total) by mouth every 6 (six) hours as needed for moderate pain.          REVIEW OF SYSTEMS: A comprehensive ROS was conducted with the patient and is negative except as per HPI    OBJECTIVE:  VS:BP 126/80 (BP Location: Left Arm, Patient Position: Sitting, Cuff Size: Large)   Pulse 61   Ht 5' 2 (1.575 m)   Wt 148 lb (67.1 kg)   SpO2 98%   BMI 27.07 kg/m    Wt Readings from Last 3 Encounters:  07/10/24 148 lb (67.1 kg)  04/02/24 148 lb (67.1 kg)  03/10/24 150 lb (68 kg)     EXAM: General: Pt appears well and is in NAD  Neck: General: Supple without adenopathy, normal size submandibular saliva glands Thyroid : no  nodule appreciated  Lungs: Clear with good BS bilat   Heart: Auscultation: RRR. + Murmur   Extremities:  BL LE: No pretibial edema   Mental Status: Judgment, insight: Intact Orientation: Oriented to time, place, and person Mood and affect: No depression, anxiety, or agitation     DATA REVIEWED:   Latest Reference Range & Units 07/10/24 09:49  Calcium  8.6 - 10.4 mg/dL 89.6    Latest Reference Range & Units 07/10/24 09:49  Vitamin D , 25-Hydroxy 30 - 100 ng/mL 86    Latest Reference Range & Units 07/10/24 09:49  PTH, Intact 16 - 77 pg/mL 84 (H)  TSH 0.40 - 4.50 mIU/L <0.01 (L)  Albumin  MSPROF 3.6 - 5.1 g/dL 4.1  (H): Data is abnormally high (L): Data is abnormally low   DXA 10/2021  AP t-score -0.5 1/3rd distal forearm  -0.9    Thyroid  pathology 10/15/2022  FINAL MICROSCOPIC DIAGNOSIS:   A. 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 Postoperative Hypothyroidism:  -She is s/p total thyroidectomy with benign pathology on 10/15/2022 - TSH low, will decrease levothyroxine  and recheck   Medication  Stop levothyroxine  150 mcg daily Start levothyroxine  125 mcg daily    3. Vitamin D  deficiency  - Ergocalciferol  has resolved vitamin D  deficiency, but her vitamin D  remains at the upper limit of normal and with her history of hypercalcemia I prefer her vitamin D  to be in the mid normal range - Will discontinue ergocalciferol  and start OTC vitamin D3 as below  Medication Stop ergocalciferol  50,000 units every other week Start vitamin D3 1000 international units daily   Follow-up in 6 months  Signed electronically by: Stefano Redgie Butts, MD  [facility] Endocrinology  [facility] [address] [place], [place] [address] Phone: [phone] FAX: [phone]   CC: Maree Leni Edyth DELENA, MD [address] [place] [address] Phone: [phone] Fax: [phone]   Return to Endocrinology clinic as below: Future Appointments  Date Time Provider Department Center  07/10/2024  9:50 AM Emilija Bohman, Donell Redgie, MD  [facility] None  07/28/2024 10:15 AM [facility] 1 [facility] H&V  08/03/2024  8:20 AM Santo Stanly DELENA, MD [facility] H&V  08/13/2024  9:10 AM Charlanne Groom, MD [facility]

## 2024-07-10 NOTE — Patient Instructions (Signed)
  Stay Hydrated  Avoid over the counter calcium  tablet  Consume 2-3 servings of calcium  in the food daily ( low fat dairy and green leafy vegetables)

## 2024-07-11 LAB — TSH: TSH: <0.01 m[IU]/L — ABNORMAL LOW (ref 0.40–4.50)

## 2024-07-11 LAB — ALBUMIN: Albumin: 4.1 g/dL (ref 3.6–5.1)

## 2024-07-11 LAB — PTH, INTACT AND CALCIUM
Calcium: 10.3 mg/dL (ref 8.6–10.4)
PTH: 84 pg/mL — ABNORMAL HIGH (ref 16–77)

## 2024-07-11 LAB — VITAMIN D 25 HYDROXY (VIT D DEFICIENCY, FRACTURES): Vit D, 25-Hydroxy: 86 ng/mL (ref 30–100)

## 2024-07-13 ENCOUNTER — Ambulatory Visit: Payer: Self-pay | Admitting: Internal Medicine

## 2024-07-13 MED ORDER — LEVOTHYROXINE SODIUM 125 MCG PO TABS: 125.0000 ug | ORAL_TABLET | Freq: Every day | ORAL | 3 refills | Status: AC

## 2024-07-13 NOTE — Telephone Encounter (Signed)
 Please let the patient know that the current dose of levothyroxine  150 mcg is too much for her, we will need to discontinue levothyroxine  150 mcg and start a new prescription of levothyroxine  125 mcg daily    Calcium  is within normal range  Vitamin D  is at the upper limit of normal, I would like for her to discontinue ergocalciferol  altogether and start over-the-counter vitamin D3 at 1000 international units daily    Please schedule her for repeat labs in 3 months  Thanks

## 2024-07-22 ENCOUNTER — Other Ambulatory Visit (HOSPITAL_COMMUNITY)

## 2024-07-28 ENCOUNTER — Ambulatory Visit (HOSPITAL_COMMUNITY): Attending: Cardiology

## 2024-08-03 ENCOUNTER — Ambulatory Visit: Attending: Internal Medicine

## 2024-08-03 ENCOUNTER — Ambulatory Visit: Attending: Internal Medicine | Admitting: Internal Medicine

## 2024-08-03 VITALS — BP 168/80 | HR 53 | Ht 63.0 in | Wt 149.2 lb

## 2024-08-03 DIAGNOSIS — I471 Supraventricular tachycardia, unspecified: Secondary | ICD-10-CM | POA: Diagnosis not present

## 2024-08-03 DIAGNOSIS — I422 Other hypertrophic cardiomyopathy: Secondary | ICD-10-CM

## 2024-08-03 DIAGNOSIS — I4729 Other ventricular tachycardia: Secondary | ICD-10-CM | POA: Insufficient documentation

## 2024-08-03 NOTE — Progress Notes (Signed)
 Cardiology Office Note:    Date:  08/03/2024   ID:  Barnie Medicine, DOB 1951/10/03, MRN [id number]  PCP:  Grace Leni Edyth DELENA, MD   [facility] Providers Cardiologist:  None     Referring MD: Grace Leni Edyth DELENA, MD   CC: HCM  History of Present Illness:    Grace Dyer is a 73 y.o. female with a hx of HCM (resting LVOT gradient 29, max thickness 19 mm, ~1% LGE using 6 standard deviation approach).   2022: August found to have an inducible gradient of 70 mm Hg. 2023: She was asymptomatic.  At our last visit she was eating chicken wings during our office visit without post prandial symptoms.  Did not wish to start medication therapy.  Did not wish to start medication or SRT interventions. 2024: since she had her thyroid  removed she is taking her metoprolol  tartrate PRN more frequently.  Grace Dyer has a history of obstructive hypertrophic cardiomyopathy and was started on CMI therapy in 2025 due to new onset shortness of breath. She experiences dyspnea during activities such as walking to pay rent or returning from the mailbox. Recently, she experienced dyspnea while going down and up the stairs, which was the first time in a long time she felt that way. Prior to starting medication, she only experienced dyspnea during longer walks.  Her fatigue levels have remained about the same since starting the medication. She has reduced her physical activity, now only walking to the mailbox every other day, as her daughter assists with errands. Occasionally, she feels the need to sit down after walking to the mailbox to avoid feeling like she might 'fall out'.  Her family history is notable for her oldest son being hospitalized for heart rhythm issues, though he has not been screened for hypertrophic cardiomyopathy. Her oldest daughter has also been hospitalized for a similar condition.  Socially, she has been smoke-free for about three years and lives next to non-smoking neighbors.  She engages in light exercise, such as chair exercises, but has not undertaken any strenuous activities.  No chest pain, syncope, or significant changes in her symptoms since the last visit.  Discussed the use of AI scribe software for clinical note transcription with the patient, who gave verbal consent to proceed.   Past Medical History:  Diagnosis Date   Aortic atherosclerosis    Cancer (HCC)    Thyroid    Dyslipidemia    Dyspnea    HCM   Heart murmur    Hypertension    Hypertrophic cardiomyopathy (HCC)    Osteoporosis    Palpitations    Sciatica    Scoliosis     Past Surgical History:  Procedure Laterality Date   ABDOMINAL HYSTERECTOMY  1997   total   HIP ARTHROPLASTY Right 2008   HIP ARTHROPLASTY Left 2013   LAPAROTOMY N/A 04/14/2023   Procedure: EXPLORATORY LAPAROTOMY with reduction of internal hernia and small bowel resection;  Surgeon: Grace Hila, MD;  Location: [facility] OR;  Service: General;  Laterality: N/A;   THYROIDECTOMY N/A 10/15/2022   Procedure: TOTAL THYROIDECTOMY;  Surgeon: Grace Boas, MD;  Location: [facility] ORS;  Service: General;  Laterality: N/A;   WISDOM TOOTH EXTRACTION     age 73    Current Medications: Current Meds  Medication Sig   levocetirizine (XYZAL ) 5 MG tablet Take 1 tablet (5 mg total) by mouth every evening.   levothyroxine  (SYNTHROID ) 125 MCG tablet Take 1 tablet (125 mcg total) by mouth daily.  mavacamten  (CAMZYOS ) 2.5 MG CAPS capsule Take 1 capsule (2.5 mg total) by mouth daily.   metoprolol  succinate (TOPROL -XL) 100 MG 24 hr tablet Take 1 tablet (100 mg total) by mouth daily. (Patient taking differently: Take 150 mg by mouth daily.)   metoprolol  tartrate (LOPRESSOR ) 25 MG tablet Take 1 tablet (25 mg total) by mouth 2 (two) times daily as needed for up to 30 doses (palpitations).   rosuvastatin  (CRESTOR ) 10 MG tablet Take 10 mg by mouth every evening.   traMADol  (ULTRAM ) 50 MG tablet Take 1 tablet (50 mg total) by mouth every 6 (six) hours  as needed for moderate pain.     Allergies:   Codeine and Shellfish allergy   Social History   Socioeconomic History   Marital status: Widowed    Spouse name: Not on file   Number of children: Not on file   Years of education: Not on file   Highest education level: Not on file  Occupational History   Not on file  Tobacco Use   Smoking status: Former    Current packs/day: 0.00    Average packs/day: 0.3 packs/day for 20.0 years (5.0 ttl pk-yrs)    Types: Cigarettes    Start date: 2001    Quit date: 2021    Years since quitting: 4.8    Passive exposure: Never   Smokeless tobacco: Never  Vaping Use   Vaping status: Never Used  Substance and Sexual Activity   Alcohol use: Yes    Comment: rare   Drug use: Never   Sexual activity: Not on file  Other Topics Concern   Not on file  Social History Narrative   Not on file   Social Drivers of Health   Financial Resource Strain: Medium Risk (05/07/2024)   Overall Financial Resource Strain (CARDIA)    Difficulty of Paying Living Expenses: Somewhat hard  Food Insecurity: No Food Insecurity (05/07/2024)   Hunger Vital Sign    Worried About Running Out of Food in the Last Year: Never true    Ran Out of Food in the Last Year: Never true  Transportation Needs: No Transportation Needs (05/07/2024)   PRAPARE - Administrator, Civil Service (Medical): No    Lack of Transportation (Non-Medical): No  Physical Activity: Not on file  Stress: Not on file  Social Connections: Not on file    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     Cardiac Studies & Procedures   ______________________________________________________________________________________________   STRESS TESTS  ECHOCARDIOGRAM STRESS TEST 05/16/2022  Narrative EXERCISE STRESS ECHO  REPORT   --------------------------------------------------------------------------------  Patient Name:   [name] Date of Exam: 05/15/2022 Medical Rec #:  [id number]        Height:       63.0 in Accession #:    [id number]       Weight:       162.0 lb Date of Birth:  08-Apr-1951        BSA:          1.768 m Patient Age:    71 years         BP:           152/73 mmHg Patient Gender: F                HR:           63  bpm. Exam Location:  [facility]  Procedure: Stress Echo, Limited Color Doppler and Cardiac Doppler  Indications:    I42.2 Hypertrophic cardiomyopathy Evaluation of the HOCM gradient post exercise.  History:        Patient has no prior history of Echocardiogram examinations. Echo performed elsewhere on Feb 15, 2022 demonstrated severe asymmetric septal hypertrophic cardiomyopathy-LVEF 70% HOCM at rest 29 mmHg and SAM. septal diameter of 1.9 cm.  Sonographer:    Nolon [facility], RDCS Referring Phys: [id number] [facility] A Marguerite Barba  IMPRESSIONS   1. This is an inconclusive stress echocardiogram for ischemia. 2. This is an indeterminate risk study for ischemia (study done for inducivle LVOT gradient). 3. At rest, hypderynamie LV with LVEF 70%. Septal thickness 26 mm. 4. There is resting systolic anterior motion of the mitral valve and associated mitral regurgitation. There is notable contimation of the LVOT gradient by mitral valve signal 5. Back calculation of gradient using mitral regurgitaiton method shows a resting gradient of 29 mm Hg but an inducible gradient of 90 mm Hg. 6. 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 7. Trivial pericardial effusion without tamponade. 8. Left Atrial dilation.  FINDINGS  Exam Protocol:   Patient Performance: The heart rate at peak stress was 125 bpm. The target heart rate was calculated to be 127 bpm. The percentage of maximum predicted heart rate achieved was 83.9 %. The baseline blood pressure was 135/69  mmHg. The blood pressure at peak stress was 135/53 mmHg. The patient developed fatigue during the stress exam.  EKG: Resting EKG showed normal sinus rhythm. The patient developed no abnormal EKG findings during exercise.   2D Echo Findings: The baseline ejection fraction was 70%. Baseline regional wall motion abnormalities were not present. This is an inconclusive stress echocardiogram for ischemia.   Stanly Leavens MD Electronically signed on 05/16/2022 at 10:50:36 AM     Final   ECHOCARDIOGRAM  ECHOCARDIOGRAM COMPLETE 06/30/2024  Narrative ECHOCARDIOGRAM LIMITED REPORT    Patient [facility]:   [facility] Date of Exam: 06/30/2024 Medical Rec #:  [id number]        Height:       62.0 in Accession #:    [id number]       Weight:       148.0 lb Date of Birth:  [birth year]        BSA:          1.682 m Patient Age:    73 years         BP:           138/84 mmHg Patient Gender: F                HR:           54 bpm. Exam Location:  [facility]  Procedure: Cardiac Doppler, Strain Analysis, 2D Echo, 3D Echo and Color Doppler (Both Spectral and Color Flow Doppler were utilized during procedure).  Indications:    HOCM I42.2; 2.5 MG Camzyos   History:        Patient has prior history of Echocardiogram examinations, most recent 06/02/2024.  Sonographer:    Augustin Seals RDCS Referring Phys: [id number] [facility] A Gaye Scorza  IMPRESSIONS   1. Left ventricular ejection fraction, by estimation, is 70 to 75%. The left ventricle has hyperdynamic function. The left ventricle has no regional wall motion abnormalities. There is severe asymmetric left ventricular hypertrophy of the basal-septal segment. Left ventricular diastolic parameters are consistent with Grade I diastolic dysfunction (impaired relaxation).  The average left ventricular global longitudinal strain is -17.2 %. 2. LVOT obstruction due to SAM of AMVL. Difficult to determine LVOT gradient given contamination with MR  signal on CW doppler, but appears peak resting gradient , increased to with Valsalva 3. Right ventricular systolic function is normal. The right ventricular size is normal. There is normal pulmonary artery systolic pressure. 4. The mitral valve is normal in structure. Mild mitral valve regurgitation. No evidence of mitral stenosis. 5. The aortic valve is tricuspid. Aortic valve regurgitation is trivial. No aortic stenosis is present. 6. The inferior vena cava is normal in size with greater than 50% respiratory variability, suggesting right atrial pressure of 3 mmHg.  FINDINGS Left Ventricle: Left ventricular ejection fraction, by estimation, is 70 to 75%. The left ventricle has hyperdynamic function. The left ventricle has no regional wall motion abnormalities. The average left ventricular global longitudinal strain is -17.2 %. There is severe asymmetric left ventricular hypertrophy of the basal-septal segment. Left ventricular diastolic parameters are consistent with Grade I diastolic dysfunction (impaired relaxation).  Right Ventricle: The right ventricular size is normal. Right ventricular systolic function is normal. There is normal pulmonary artery systolic pressure. The tricuspid regurgitant velocity is 2.42 m/s, and with an assumed right atrial pressure of 3 mmHg, the estimated right ventricular systolic pressure is 26.4 mmHg.  Pericardium: There is no evidence of pericardial effusion.  Mitral Valve: The mitral valve is normal in structure. Mild mitral valve regurgitation. No evidence of mitral valve stenosis.  Aortic Valve: The aortic valve is tricuspid. Aortic valve regurgitation is trivial. No aortic stenosis is present.  Aorta: The aortic root is normal in size and structure.  Venous: The inferior vena cava is normal in size with greater than 50% respiratory variability, suggesting right atrial pressure of 3 mmHg.  LEFT VENTRICLE PLAX 2D LVIDd:         3.70 cm    Diastology LVIDs:         2.40 cm   LV e' medial:    6.64 cm/s LV PW:         1.50 cm   LV E/e' medial:  7.5 LV IVS:        1.90 cm   LV e' lateral:   10.30 cm/s LVOT diam:     2.40 cm   LV E/e' lateral: 4.8 LVOT Area:     4.52 cm 2D Longitudinal Strain 2D Strain GLS (A4C):   -16.6 % 2D Strain GLS (A3C):   -15.9 % 2D Strain GLS (A2C):   -19.1 % 2D Strain GLS Avg:     -17.2 %  3D Volume EF: 3D EF:        59 % LV EDV:       156 ml LV ESV:       63 ml LV SV:        92 ml  IVC IVC diam: 1.60 cm  LEFT ATRIUM         Index LA diam:    3.90 cm 2.32 cm/m  AORTA Ao Root diam: 3.30 cm  MITRAL VALVE               TRICUSPID VALVE MV Area (PHT): 2.80 cm    TR Peak grad:   23.4 mmHg MV Decel Time: 271 msec    TR Vmax:        242.00 cm/s MV E velocity: 49.85 cm/s MV A velocity: 75.55 cm/s  SHUNTS MV E/A ratio:  0.66  Systemic Diam: 2.40 cm  Lonni Nanas MD Electronically signed by Lonni Nanas MD Signature Date/Time: 06/30/2024/10:58:00 AM    Final    MONITORS  LONG TERM MONITOR (3-14 DAYS) 03/12/2023  Narrative   Patient had a minimum heart rate of 41 bpm, maximum heart rate of 179 bpm, and average heart rate of 57 bpm.   Predominant underlying rhythm was sinus rhythm.   Two short runs of NSVT lasting 7 beats at longest.   Short runs of atrial tachycardia, longest 19 beats.   Isolated PACs were rare (<1.0%).   Isolated PVCs were rare (<1.0%).   Triggered and diary events associated with sinus rhythm PACs or PVCs  Rare NSVT in the setting of HCM.     CARDIAC MRI  MR CARDIAC MORPHOLOGY W WO CONTRAST 04/19/2022  Narrative CLINICAL DATA:  Hypertrophic Cardiomyopathy  EXAM: CARDIAC MRI  TECHNIQUE: The patient was scanned on a 1.5 Tesla Siemens magnet. A dedicated cardiac coil was used. Functional imaging was done using Fiesta sequences. 2,3, and 4 chamber views were done to assess for RWMA's. Modified Simpson's rule using a short axis stack  was used to calculate an ejection fraction on a dedicated work [employer]. The patient received 9 cc of Gadavist . After 10 minutes inversion recovery sequences were used to assess for infiltration and scar tissue.  CONTRAST:  Gadavist   FINDINGS: Severe LAE. Normal RA. No ASD/PFO. Normal ascending thoracic aorta 3.4 cm. Small to moderate circumferential pericardial effusion Mildly thickened MV with SAM and mild/moderate appearing MR. Normal TV/PV. Normal tri leaflet AV Severe asymmetrical septal hypertrophy 19 mm with lateral wall 9 mm Quantitative EF 72% (EDV 132 cc ESV 37 cc SV 95 cc) Normal RV size and function  T2 normal 51 msec  T1 global mildly increased 1076 msec  ECV: Diffusely elevated > 55%  Delayed gadolinium images show diffuse mid/sub epicardial uptake including regions extending beyond the hypertrophied septum  IMPRESSION: 1. Morphologic findings consistent with hypertrophic cardiomyopathy with asymmetric septal hypertrophy 19 mm, SAM with LVOT turbulence and MR.  2. Diffuse mid/subepicardial gadolinium uptake and diffusely elevated T1/ECV suggest possibility of amyloid masquerading as HOCM  3.  Severe LAE  4.  Mild/Moderate circumferential pericardial effusion  5. Suggest f/u Echo with strain imaging and Tc Pyrophosphate scan to further investigate  Maude Emmer   Electronically Signed By: Maude Emmer M.D. On: 04/19/2022 17:54   ______________________________________________________________________________________________       Recent Labs: 09/16/2023: ALT 18 03/10/2024: BUN 16; Creat 0.90; Potassium 4.4; Sodium 138 07/10/2024: TSH <0.01  Recent Lipid Panel    Component Value Date/Time   CHOL 159 09/16/2023 0934   TRIG 50 09/16/2023 0934   HDL 93 09/16/2023 0934   CHOLHDL 1.7 09/16/2023 0934   LDLCALC 55 09/16/2023 0934        Physical Exam:    VS:  BP (!) 168/80 (BP Location: Right Arm, Patient Position: Sitting)    Pulse (!) 53   Ht 5' 3 (1.6 m)   Wt 149 lb 3.2 oz (67.7 kg)   SpO2 96%   BMI 26.43 kg/m     Wt Readings from Last 3 Encounters:  08/03/24 149 lb 3.2 oz (67.7 kg)  07/10/24 148 lb (67.1 kg)  04/02/24 148 lb (67.1 kg)    GEN:  Well nourished, well developed in no acute distress HEENT: Normal NECK: No JVD CARDIAC: regular bradycardia with harsh systolic murmur with hand grip, with standing, no rubs, gallops  RESPIRATORY:  Clear  to auscultation without rales, wheezing or rhonchi  ABDOMEN: Soft, non-tender, non-distended MUSCULOSKELETAL:  No edema; No deformity  SKIN: Warm and dry NEUROLOGIC:  Alert and oriented x 3 PSYCHIATRIC:  Normal affect   ASSESSMENT/PLAN:    Hypertrophic Obstructive Cardiomyopathy (HOCM)   - Septal Variant - Inducible gradient of 50 mm Hg on 2.5 mg Mavacamten  - maximal septal thickness of 19 mm - NYHA II-III with resting bradycardia - Gene: negative - HCM- AF score 22, hx ov NSVT, no NSVT on last eval and no AF - one week non live heart monitor for SVT - CMR notable thickness 19 mm, minimal LGE using 6 standard deviation method - Family history with no prior HCM or SCD, Discussed family screening  (Genetic eval 02/2023; eldest son screened; eldest daughter screened negative in 2024; youngest daughter still pending screening) - Symptoms include exertional dyspnea without chest pain, syncope, or lightheadedness. No significant change since initiation of CMI therapy. Differential diagnosis includes potential non-cardiac causes for dyspnea. Surgical myectomy preferred but not pursued at this time. - Perform echocardiogram to evaluate cardiac function and gradient as per CMI protocols - Consider pulmonary function testing if echocardiogram is normal and symptoms persist. - Discuss potential surgical options, including myectomy, in 2026 if symptoms persist and other causes are ruled out.  Former Smoker - if SX when gradient is controlled, will get PFTs   Non  obstructive CAD Hyperlipidemia  with aortic atherosclerosis - no sx, LDL goal < 70; no change in therapy - if considering  Hypertension   - Continue current antihypertensive medications, elevated BP today outside of her norm. I will recheck her BP at 08/13/24 echo and make and MRA based on results  Longitudinal care: The evaluation and management services provided today reflect the complexity inherent in caring for this patient, including the ongoing longitudinal relationship and management of multiple chronic conditions and/or the need for care coordination. The visit required a comprehensive assessment and management plan tailored to the patient's unique needs Time was spent addressing not only the acute concerns but also the broader context of the patient's health, including preventive care, chronic disease management, and care coordination as appropriate.  Complex longitudinal is necessary for conditions including: symptomatic HCM despite improved gradients and CMI therapy   Stanly Leavens, MD FASE [facility] Cardiologist [facility]  [address] [place], [place] [address] [phone]  9:52 AM

## 2024-08-03 NOTE — Patient Instructions (Signed)
 Medication Instructions:  No changes *If you need a refill on your cardiac medications before your next appointment, please call your pharmacy*  Lab Work: None ordered If you have labs (blood work) drawn today and your tests are completely normal, you will receive your results only by: MyChart Message (if you have MyChart) OR A paper copy in the mail If you have any lab test that is abnormal or we need to change your treatment, we will call you to review the results.  Testing/Procedures: Your physician has recommended that you wear a 7 DAY ZIO-PATCH monitor. The Zio patch cardiac monitor continuously records heart rhythm data for up to 14 days, this is for patients being evaluated for multiple types heart rhythms. For the first 24 hours post application, please avoid getting the Zio monitor wet in the shower or by excessive sweating during exercise. After that, feel free to carry on with regular activities. Keep soaps and lotions away from the ZIO XT Patch.  This will be mailed to you, please expect 7-10 days to receive.    Applying the monitor   Shave hair from upper left chest.   Hold abrader disc by orange tab.  Rub abrader in 40 strokes over left upper chest as indicated in your monitor instructions.   Clean area with 4 enclosed alcohol pads .  Use all pads to assure are is cleaned thoroughly.  Let dry.   Apply patch as indicated in monitor instructions.  Patch will be place under collarbone on left side of chest with arrow pointing upward.   Rub patch adhesive wings for 2 minutes.Remove white label marked 1.  Remove white label marked 2.  Rub patch adhesive wings for 2 additional minutes.   While looking in a mirror, press and release button in center of patch.  A small green light will flash 3-4 times .  This will be your only indicator the monitor has been turned on.     Do not shower for the first 24 hours.  You may shower after the first 24 hours.   Press button if you  feel a symptom. You will hear a small click.  Record Date, Time and Symptom in the Patient Log Book.   When you are ready to remove patch, follow instructions on last 2 pages of Patient Log Book.  Stick patch monitor onto last page of Patient Log Book.   Place Patient Log Book in [place] box.  Use locking tab on box and tape box closed securely.  The [employer] has [employer] on it.  Please place in mailbox as soon as possible.  Your physician should have your test results approximately 7 days after the monitor has been mailed back to [facility].   Call [facility] Customer Care at [phone] if you have questions regarding your ZIO XT patch monitor.  Call them immediately if you see an orange light blinking on your monitor.   If your monitor falls off in less than 4 days contact our Monitor department at [phone].  If your monitor becomes loose or falls off after 4 days call [employer] at [phone] for suggestions on securing your monitor   Follow-Up: At [facility], you and your health needs are our priority.  As part of our continuing mission to provide you with exceptional heart care, our providers are all part of one team.  This team includes your primary Cardiologist (physician) and Advanced Practice Providers or APPs (Physician Assistants and Nurse Practitioners)  who all work together to provide you with the care you need, when you need it.  Your next appointment:    March 2026  Provider:   Dr Santo  We recommend signing up for the patient portal called MyChart.  Sign up information is provided on this After Visit Summary.  MyChart is used to connect with patients for Virtual Visits (Telemedicine).  Patients are able to view lab/test results, encounter notes, upcoming appointments, etc.  Non-urgent messages can be sent to your provider as well.   To learn more about what you can do with MyChart, go to [email or web address].

## 2024-08-03 NOTE — Progress Notes (Unsigned)
 Enrolled for [employer] to mail a ZIO XT long term holter monitor to the patients address on file.

## 2024-08-04 ENCOUNTER — Telehealth: Payer: Self-pay

## 2024-08-04 DIAGNOSIS — I422 Other hypertrophic cardiomyopathy: Secondary | ICD-10-CM

## 2024-08-04 NOTE — Telephone Encounter (Addendum)
 Patient has previous refill with 90 days supply and 2 refills. Will see if we still need to send in a 5 day supply.

## 2024-08-04 NOTE — Telephone Encounter (Signed)
Patient does not need any refills at this time

## 2024-08-04 NOTE — Telephone Encounter (Signed)
-----   Message from Stanly DELENA Leavens sent at 08/04/2024  1:28 PM EDT ----- Regarding: CMI Therapy Hey Celeste (and Pam),  Ms. Keller missed her prior echo and is getting another one 08/13/24 (she apologized; life happens).  Would send a 5 day prescription for her current mavacamten  dose.  This should help bridge the gap until the echo, when we can submit the next patient safety form.   Thanks!

## 2024-08-05 MED ORDER — MAVACAMTEN 2.5 MG PO CAPS: 2.5000 mg | ORAL_CAPSULE | Freq: Every day | ORAL | 0 refills | Status: DC

## 2024-08-05 NOTE — Addendum Note (Signed)
 Addended by: DRENA MARTINIS, Jeffory Snelgrove L on: 08/05/2024 02:13 PM   Modules accepted: Orders

## 2024-08-05 NOTE — Telephone Encounter (Signed)
 Called patient about medication. Patient stated she does not have enough to get her to the echocardiogram appointment. Will put in refill for 5 pills. Encouraged patient to call back if she has any other issues. Patient verbalized understanding.

## 2024-08-05 NOTE — Telephone Encounter (Signed)
 I dont think specialty meds can be dispensed in quantities of 90 days, but it does look like she got a 30 day supply on 9/30 and then on 10/21

## 2024-08-12 LAB — BASIC METABOLIC PANEL WITH GFR
BUN: 16 mg/dL (ref 7–25)
CO2: 30 mmol/L (ref 20–32)
Calcium: 10.4 mg/dL (ref 8.6–10.4)
Chloride: 102 mmol/L (ref 98–110)
Creat: 0.9 mg/dL (ref 0.60–1.00)
Glucose, Bld: 90 mg/dL (ref 65–139)
Potassium: 4.4 mmol/L (ref 3.5–5.3)
Sodium: 138 mmol/L (ref 135–146)
eGFR: 68 mL/min/1.73m2 (ref >=60)

## 2024-08-12 LAB — TSH: TSH: 1.03 m[IU]/L (ref 0.40–4.50)

## 2024-08-12 LAB — PARATHYROID HORMONE, INTACT (NO CA): PTH: 94 pg/mL — ABNORMAL HIGH (ref 16–77)

## 2024-08-12 LAB — ALBUMIN: Albumin: 4.4 g/dL (ref 3.6–5.1)

## 2024-08-12 LAB — VITAMIN D 25 HYDROXY (VIT D DEFICIENCY, FRACTURES): Vit D, 25-Hydroxy: 80 ng/mL (ref 30–100)

## 2024-08-13 ENCOUNTER — Ambulatory Visit: Admitting: Gastroenterology

## 2024-08-13 ENCOUNTER — Ambulatory Visit (HOSPITAL_COMMUNITY)
Admission: RE | Admit: 2024-08-13 | Discharge: 2024-08-13 | Disposition: A | Discharge status: Home / Self Care | Source: Ambulatory Visit | Attending: Cardiovascular Disease | Admitting: Cardiovascular Disease

## 2024-08-13 DIAGNOSIS — I422 Other hypertrophic cardiomyopathy: Secondary | ICD-10-CM

## 2024-08-13 LAB — ECHOCARDIOGRAM LIMITED
Area-P 1/2: 2.69 cm2
S' Lateral: 2.42 cm

## 2024-08-14 ENCOUNTER — Ambulatory Visit: Payer: Self-pay

## 2024-08-14 ENCOUNTER — Encounter: Payer: Self-pay | Admitting: Cardiovascular Disease

## 2024-08-14 MED ORDER — MAVACAMTEN 5 MG PO CAPS: 5.0000 mg | ORAL_CAPSULE | Freq: Every day | ORAL | 0 refills | Status: DC

## 2024-08-14 NOTE — Telephone Encounter (Addendum)
 Spoke to Dr. Santo on the phone. Patient had Echo yesterday.   LVEF 65% LVOT gradient 79 mmHg   Per Dr. Santo patient's dose should be increased to 5 mg on the Camzyos . She will need 4 week and 12 week echo.  Called patient and discussed Dr. Milissa advisement.   Patient denies any experience with clinical heart failure event requiring hospitalization. Patient has not started any new prescription, nonprescription medications, and/or supplements. Patient did state she did have some flutters which is not new, and monitor has been ordered and waiting on results.  Sent Camzyos  5 mg to patient's pharmacy. Placed order for 4 week and 12 week echo.  [facility] Portal update.

## 2024-08-14 NOTE — Addendum Note (Signed)
 Addended by: DRENA MARTINIS, Skylin Kennerson L on: 08/14/2024 03:26 PM   Modules accepted: Orders

## 2024-08-26 ENCOUNTER — Ambulatory Visit: Payer: Self-pay | Admitting: Internal Medicine

## 2024-08-26 DIAGNOSIS — I4729 Other ventricular tachycardia: Secondary | ICD-10-CM

## 2024-09-02 NOTE — Telephone Encounter (Signed)
 Letter of results sent to pt

## 2024-09-07 ENCOUNTER — Telehealth: Payer: Self-pay | Admitting: Internal Medicine

## 2024-09-11 ENCOUNTER — Ambulatory Visit (HOSPITAL_COMMUNITY): Admission: RE | Admit: 2024-09-11 | Discharge: 2024-09-11 | Attending: Cardiovascular Disease

## 2024-09-11 DIAGNOSIS — I422 Other hypertrophic cardiomyopathy: Secondary | ICD-10-CM

## 2024-09-11 LAB — ECHOCARDIOGRAM COMPLETE
Area-P 1/2: 3.42 cm2
MV M vel: 5.81 m/s
MV Peak grad: 135 mmHg
S' Lateral: 2.8 cm

## 2024-09-13 ENCOUNTER — Ambulatory Visit: Payer: Self-pay | Admitting: Internal Medicine

## 2024-09-15 NOTE — Telephone Encounter (Signed)
 Patient is following up. She says she only has 1 tablet remaining.

## 2024-09-16 ENCOUNTER — Telehealth: Payer: Self-pay | Admitting: Internal Medicine

## 2024-09-16 DIAGNOSIS — I421 Obstructive hypertrophic cardiomyopathy: Secondary | ICD-10-CM

## 2024-09-16 MED ORDER — MAVACAMTEN 5 MG PO CAPS: 5.0000 mg | ORAL_CAPSULE | Freq: Every day | ORAL | 1 refills | Status: DC

## 2024-09-16 NOTE — Telephone Encounter (Signed)
 Left a message to call back.

## 2024-09-16 NOTE — Telephone Encounter (Signed)
 Pt c/o medication issue:  1. Name of Medication:   mavacamten  (CAMZYOS ) 5 MG CAPS capsule   2. How are you currently taking this medication (dosage and times per day)?   3. Are you having a reaction (difficulty breathing--STAT)?   4. What is your medication issue?   Caller Jenelle) stated they will need patient's Echocardiogram results uploaded to [facility] so patient's medication can be dispensed and refill prescription.  Caller stated patient noted she took her last capsule today.

## 2024-09-16 NOTE — Telephone Encounter (Signed)
 Spoke with pt.  Reports has not noticed a difference in the way feels since increase of mavacamten  to 5 mg PO every day.  Has noted swelling to right ankle only.  Started after dose increase.   Pt reports props feet up during the day.  Swelling goes down at night but returns when up on feet.  Denies weight gain reports has lost weight.  No swelling noted to other areas.  Denies increased salt intake reports BP is okay.   Advised pt to try propping feet up more and wear compression socks during the day.  Will send to MD to advise further.   Pt has not started any new medications.   PSF updated on Camzyos  [facility] portal.  Refill of mavacamten  5 mg sent to specialty pharmacy.

## 2024-09-17 MED ORDER — FUROSEMIDE 20 MG PO TABS: 20.0000 mg | ORAL_TABLET | Freq: Every day | ORAL | 2 refills | Status: AC | PRN

## 2024-09-17 NOTE — Telephone Encounter (Signed)
 Called pt advised of MD response: Let's add lasix 20 mg PO PRN Leg swelling.  If she is uptitrated and doesn't feel better we can talk about other options.   Thanks, MAC  Pt expresses understanding no further question or concerns.

## 2024-11-06 ENCOUNTER — Ambulatory Visit (HOSPITAL_COMMUNITY)
Admission: RE | Admit: 2024-11-06 | Discharge: 2024-11-06 | Disposition: A | Source: Ambulatory Visit | Attending: Internal Medicine

## 2024-11-06 DIAGNOSIS — I421 Obstructive hypertrophic cardiomyopathy: Secondary | ICD-10-CM | POA: Diagnosis present

## 2024-11-06 LAB — ECHOCARDIOGRAM LIMITED
AR max vel: 2.7 cm2
AV Area VTI: 2.76 cm2
AV Area mean vel: 2.57 cm2
AV Mean grad: 7 mmHg
AV Peak grad: 12.1 mmHg
Ao pk vel: 1.74 m/s
Area-P 1/2: 3.03 cm2
P 1/2 time: 450 ms
S' Lateral: 1.6 cm

## 2024-11-08 ENCOUNTER — Ambulatory Visit: Payer: Self-pay | Admitting: Internal Medicine

## 2024-11-08 DIAGNOSIS — I421 Obstructive hypertrophic cardiomyopathy: Secondary | ICD-10-CM

## 2024-11-10 ENCOUNTER — Other Ambulatory Visit: Payer: Self-pay | Admitting: Internal Medicine

## 2024-11-11 NOTE — Telephone Encounter (Signed)
 Called pt reports has not started any new medications.  No HF symptoms to report.   PSF updated in [facility] portal.  Next echo is a 6 month pt request that I schedule and send an appointment reminder in the mail.  Refill of mavacamten  5 mg PO every day sent to specialty pharmacy on file by pharmacist.

## 2024-12-22 ENCOUNTER — Ambulatory Visit: Admitting: Internal Medicine

## 2025-04-19 ENCOUNTER — Ambulatory Visit (HOSPITAL_COMMUNITY)

## 2025-07-12 ENCOUNTER — Ambulatory Visit: Admitting: Internal Medicine
